# Patient Record
Sex: Male | Born: 1990
Health system: Southern US, Community
[De-identification: ages and names within clinical notes are randomized; demographics above are authoritative.]

## PROBLEM LIST (undated history)

## (undated) DIAGNOSIS — Q675 Congenital deformity of spine: Secondary | ICD-10-CM

## (undated) DIAGNOSIS — R1084 Generalized abdominal pain: Secondary | ICD-10-CM

## (undated) DIAGNOSIS — R0602 Shortness of breath: Secondary | ICD-10-CM

## (undated) DIAGNOSIS — L708 Other acne: Secondary | ICD-10-CM

## (undated) DIAGNOSIS — F411 Generalized anxiety disorder: Secondary | ICD-10-CM

## (undated) DIAGNOSIS — R51 Headache: Secondary | ICD-10-CM

## (undated) DIAGNOSIS — L600 Ingrowing nail: Secondary | ICD-10-CM

## (undated) HISTORY — DX: Shortness of breath: R06.02

## (undated) HISTORY — DX: Congenital deformity of spine: Q67.5

## (undated) HISTORY — DX: Other acne: L70.8

## (undated) HISTORY — DX: Generalized anxiety disorder: F41.1

## (undated) HISTORY — DX: Headache: R51

## (undated) HISTORY — DX: Generalized abdominal pain: R10.84

## (undated) HISTORY — DX: Ingrowing nail: L60.0

---
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
## 2011-01-16 ENCOUNTER — Encounter: Payer: Self-pay | Admitting: Family Medicine

## 2011-01-19 ENCOUNTER — Ambulatory Visit (INDEPENDENT_AMBULATORY_CARE_PROVIDER_SITE_OTHER): Payer: 59 | Admitting: Family Medicine

## 2011-01-19 ENCOUNTER — Encounter: Payer: Self-pay | Admitting: Family Medicine

## 2011-01-19 DIAGNOSIS — B079 Viral wart, unspecified: Secondary | ICD-10-CM

## 2011-01-19 DIAGNOSIS — G43109 Migraine with aura, not intractable, without status migrainosus: Secondary | ICD-10-CM

## 2011-01-19 DIAGNOSIS — B078 Other viral warts: Secondary | ICD-10-CM

## 2011-01-19 MED ORDER — CYCLOBENZAPRINE HCL 5 MG PO TABS: 5.0000 mg | ORAL_TABLET | Freq: Two times a day (BID) | ORAL | Status: DC | PRN

## 2011-01-19 NOTE — Patient Instructions (Addendum)
Keep headache diary to see if we can find triggers for your migraines. Return in 1-2 months for follow up with Dr. Ermalene Searing. Flexeril for migraines provided today - watch it can make you sleepy so just take at home. Take medicine for headaches right at onset of headache to prevent it from lasting the whole period of 3 days. If any confusion, dizziness, or other changes or worsening, please return sooner to be seen. Call us with questions.  Migraine Headache A migraine headache is an intense, throbbing pain on one or both sides of your head. The exact cause of a migraine headache is not always known. A migraine may be caused when nerves in the brain become irritated and release chemicals that cause swelling (inflammation) within blood vessels, causing pain. Many migraine sufferers have a family history of migraines. Before you get a migraine you may or may not get an aura. An aura is a group of symptoms that can predict the beginning of a migraine. An aura may include:  Visual changes such as:   Flashing lights.   Seeing bright spots or zig-zag lines.   Tunnel vision.   Feelings of numbness.   Trouble talking.   Muscle weakness.  SYMPTOMS OF A MIGRAINE  A migraine headache has one or more of the following symptoms:  Pain on one or both sides of your head.   Pain that is pulsating or throbbing in nature.   Pain that is severe enough to prevent daily activities.   Pain that is aggravated by any daily physical activity.   Nausea (feeling sick to your stomach), vomiting or both.   Pain with exposure to bright lights, loud noises or activity.   General sensitivity to bright lights or loud noises.  MIGRAINE TRIGGERS A migraine headache can be triggered by many things. Examples of triggers include:   Alcohol.   Smoking.   Stress.   It may be related to menses (male menstruation).   Aged cheeses.   Foods or drinks that contain nitrates, glutamate, aspartame or tyramine.     Lack of sleep.   Chocolate.   Caffeine.   Hunger.   Medications such as nitroglycerine (used to treat chest pain), birth control pills, estrogen and some blood pressure medications.  DIAGNOSIS  A migraine headache is often diagnosed based on:   Your symptoms.   Physical examination.  HOME CARE INSTRUCTIONS  Medications can help prevent migraines if they are recurrent or should they become recurrent. Your caregiver can help you with a medication or treatment program that will be helpful to you.   If you get a migraine, it may be helpful to lie down in a dark, quiet room.   It may be helpful to keep a headache diary. This may help you find a trend as to what may be triggering your headaches.  SEEK IMMEDIATE MEDICAL CARE IF:   You do not get relief from the medications given to you or you have a recurrence of pain.   You have confusion, personality changes or seizures.   You have headaches that wake you from sleep.   You have an increased frequency in your headaches.   You have a stiff neck.   You have a loss of vision.   You have muscle weakness.   You start losing your balance or have trouble walking.   You feel faint or pass out.  MAKE SURE YOU:   Understand these instructions.   Will watch your condition.   Will  get help right away if you are not doing well or get worse.  Document Released: 10/12/2005 Document Re-Released: 08/09/2009 [facility] Patient Information 2011 [place], [place].

## 2011-01-19 NOTE — Assessment & Plan Note (Signed)
rec keep headache diary Discussed avoidance of triggers. Discussed backing away from smoking, EtOH, and MJ. Discussed flexeril use for abortive.  Not frequent enough to merit ppx medication. Not progressively worsening, no red flags.   F/u 1-2 mo with PCP.

## 2011-01-19 NOTE — Assessment & Plan Note (Signed)
Discussed otc treatment with salicylic acid for periungal and plantar.

## 2011-01-19 NOTE — Progress Notes (Signed)
  Subjective:    Patient ID: [name], male    DOB: [birth year], 20 y.o.   MRN: [id number]  CC: frequent migraines  HPI HA issues for 2-3 years now.  Staying about same, no improvement, no worsening.  Told he has migraines in past.  Has seen neurologist in past, s/p EEG, "no seizure" per patient.  "Spaces out" prior to headache, vision "goes out" and sees squiggly line at corner of eye.  Describes vision issues as part of visual field lost 2/2 "wavy lines", never full visual loss or blacking out.  This lasts 1 day.  About 16 hrs into this starts having HA.  Described as usually R sided and posterior, pressure spreading through head.  This becomes sharp R sided boring pain as well as post ocular pain.  Takes tylenol or advil when this happens.  Also tried friend's oxycodone once.  HA complex usually lasts 3 days.  + photo/phonophobia, nausea.  Sensitive to smells.  No vomiting.  Cool environment helps.  Doesn't think getting enough sleep.  HA happen monthly or every 2 months.  Usually summer time worsens headaches.  Unsure about other triggers.  H/o anxiety and panic attacks.  Feels anxiety when gets headache.  No caffeine.  Smoking 1 cigarette/day.  MJ every other date.  EtOH occasionally.  12th grade at [employer], wants to go to [employer], hasn't looked into this.  Passing all classes.  Not stressed at school or at home.  Also asks about warts on knees and one on thumb, one on R foot  Review of Systems Per HPI. No dizziness.  No lightheadedness. No recent LOC, no dysarthria. No snoring.    Objective:   Physical Exam  [vitalsreviewed. Constitutional: He is oriented to person, place, and time. He appears well-developed and well-nourished. No distress.  HENT:  Head: Normocephalic and atraumatic.  Right Ear: External ear normal.  Left Ear: External ear normal.  Mouth/Throat: Oropharynx is clear and moist. No oropharyngeal exudate.  Eyes: Conjunctivae are normal. Pupils are equal,  round, and reactive to light. No scleral icterus.  Neck: Normal range of motion. Neck supple. No thyromegaly present.  Cardiovascular: Normal rate, regular rhythm, normal heart sounds and intact distal pulses.  Exam reveals no gallop.   No murmur heard. Pulmonary/Chest: Effort normal and breath sounds normal. No respiratory distress. He has no wheezes. He has no rales.  Neurological: He is alert and oriented to person, place, and time. He has normal strength and normal reflexes. He is not disoriented. No cranial nerve deficit or sensory deficit. He exhibits normal muscle tone. He displays a negative Romberg sign. Coordination and gait normal.       Normal FTN, HTS, neg pronator drift, no dysdiadokokinesia, able to heel and toe walk. Temperature and texture discrimination intact Intact visual fields.  Skin:       L thumb periungal veruccae. R plantar wart bialteral knee verrucae vulgaris, larger (~2cm)          Assessment & Plan:

## 2011-03-21 ENCOUNTER — Encounter: Payer: Self-pay | Admitting: Family Medicine

## 2011-03-27 ENCOUNTER — Ambulatory Visit: Payer: 59 | Admitting: Family Medicine

## 2011-04-28 ENCOUNTER — Ambulatory Visit: Payer: 59 | Admitting: Family Medicine

## 2011-07-13 ENCOUNTER — Other Ambulatory Visit: Payer: Self-pay | Admitting: *Deleted

## 2011-07-13 MED ORDER — CYCLOBENZAPRINE HCL 5 MG PO TABS: 5.0000 mg | ORAL_TABLET | Freq: Two times a day (BID) | ORAL | Status: DC | PRN

## 2011-07-13 NOTE — Telephone Encounter (Signed)
Pt's mother is requesting a refill on flexeril, pt has had a headache since Saturday.  Uses [facility].

## 2011-07-13 NOTE — Telephone Encounter (Signed)
Reviewed 12/2010 note. Flexeril for migraine  Notify pt that rx sent in.

## 2011-07-13 NOTE — Telephone Encounter (Signed)
Patient mother advised

## 2013-06-07 ENCOUNTER — Ambulatory Visit (INDEPENDENT_AMBULATORY_CARE_PROVIDER_SITE_OTHER)
Admission: RE | Admit: 2013-06-07 | Discharge: 2013-06-07 | Disposition: A | Discharge status: Home / Self Care | Payer: 59 | Source: Ambulatory Visit | Attending: Family Medicine | Admitting: Family Medicine

## 2013-06-07 ENCOUNTER — Encounter: Payer: Self-pay | Admitting: Family Medicine

## 2013-06-07 ENCOUNTER — Ambulatory Visit (INDEPENDENT_AMBULATORY_CARE_PROVIDER_SITE_OTHER): Payer: 59 | Admitting: Family Medicine

## 2013-06-07 VITALS — BP 102/54 | HR 80 | Temp 98.3°F | Ht 62.5 in | Wt 128.0 lb

## 2013-06-07 DIAGNOSIS — M8580 Other specified disorders of bone density and structure, unspecified site: Secondary | ICD-10-CM

## 2013-06-07 DIAGNOSIS — B078 Other viral warts: Secondary | ICD-10-CM

## 2013-06-07 DIAGNOSIS — B079 Viral wart, unspecified: Secondary | ICD-10-CM

## 2013-06-07 DIAGNOSIS — M948X9 Other specified disorders of cartilage, unspecified sites: Secondary | ICD-10-CM

## 2013-06-07 DIAGNOSIS — R0602 Shortness of breath: Secondary | ICD-10-CM

## 2013-06-07 DIAGNOSIS — M954 Acquired deformity of chest and rib: Secondary | ICD-10-CM | POA: Insufficient documentation

## 2013-06-07 NOTE — Progress Notes (Signed)
  Subjective:    Patient ID: [name], male    DOB: 1991-05-25, 22 y.o.   MRN: [id number]  HPI  22 year old male presents with new plantars wart on right foot at heel. Noted in last few weeks.  He has not treated with any OTC  Meds. Area is tender to pressure with standing.   He also has noted that left rib cage at base is more prominent than right, also area of indentation. Has been present for years. He and his mother is worried that this may cause SOB and may be due for this ? Bone disorder they had. At age 22 he had injury from handle bars during fall He has some occ shortness of breath ongoing for years, intermittant... Feels like he cannot fill his lungs completely. Sometimes worse than others. Had breathing test nml in past in 2010. He has benign bone disorder... Seen at [facility]... We have no records.  He does have some anxiety... He is worried that something is always about to happen.  No recent panic attacks. Cannot relax fully. No depression, no suicidal thoughts.     Review of Systems  Constitutional: Negative for fever and fatigue.  HENT: Negative for ear pain.   Eyes: Negative for pain.  Respiratory: Positive for shortness of breath. Negative for chest tightness and wheezing.   Cardiovascular: Positive for chest pain. Negative for palpitations and leg swelling.  Gastrointestinal: Negative for abdominal pain.  Neurological:       Occ tingling in lips, numbness in arms, irritability, feels like he has nto be moving, going.       Objective:   Physical Exam  Constitutional: Vital signs are normal. He appears well-developed and well-nourished.  HENT:  Head: Normocephalic.  Right Ear: Hearing normal.  Left Ear: Hearing normal.  Nose: Nose normal.  Mouth/Throat: Oropharynx is clear and moist and mucous membranes are normal.  Neck: Trachea normal. Carotid bruit is not present. No mass and no thyromegaly present.  Cardiovascular: Normal rate, regular  rhythm and normal pulses.  Exam reveals no gallop, no distant heart sounds and no friction rub.   No murmur heard. No peripheral edema  Pulmonary/Chest: Effort normal and breath sounds normal. No respiratory distress.  Left chest wall prominent with more lateral indentation.  Skin: Skin is warm, dry and intact. No rash noted.  Plantar wart on left heelp  Psychiatric: His speech is normal. Thought content normal. His mood appears anxious. He is agitated. He expresses no homicidal and no suicidal ideation. He expresses no suicidal plans and no homicidal plans.          Assessment & Plan:

## 2013-06-07 NOTE — Assessment & Plan Note (Signed)
Eval with CXR, rib films.

## 2013-06-07 NOTE — Assessment & Plan Note (Signed)
Previous PFTs nml. ? Related to restriction from chest wall deformity. Will eval with X-ray.  Consider anxiety as primary diagnosis given pt appearance today.

## 2013-06-07 NOTE — Assessment & Plan Note (Signed)
Treat with salicylic acid. Follow up if not improving.

## 2013-06-07 NOTE — Patient Instructions (Addendum)
Apply salicylic acid daily cover with duct tape... Plan concsistent application for 1-2 months. Call for cryotherapy if not resolving at that point. We will call with X-ray results.

## 2013-06-07 NOTE — Assessment & Plan Note (Signed)
Per [facility]... Likely benign process.. Not consistent with meloheostosis.

## 2013-06-13 ENCOUNTER — Ambulatory Visit (INDEPENDENT_AMBULATORY_CARE_PROVIDER_SITE_OTHER): Payer: 59 | Admitting: Family Medicine

## 2013-06-13 ENCOUNTER — Encounter: Payer: Self-pay | Admitting: Family Medicine

## 2013-06-13 VITALS — BP 104/58 | HR 84 | Temp 98.2°F | Ht 62.5 in | Wt 126.0 lb

## 2013-06-13 DIAGNOSIS — R0602 Shortness of breath: Secondary | ICD-10-CM

## 2013-06-13 DIAGNOSIS — M954 Acquired deformity of chest and rib: Secondary | ICD-10-CM

## 2013-06-13 DIAGNOSIS — F411 Generalized anxiety disorder: Secondary | ICD-10-CM

## 2013-06-13 DIAGNOSIS — R002 Palpitations: Secondary | ICD-10-CM

## 2013-06-13 DIAGNOSIS — F410 Panic disorder [episodic paroxysmal anxiety] without agoraphobia: Secondary | ICD-10-CM

## 2013-06-13 MED ORDER — ALPRAZOLAM 0.25 MG PO TABS: 0.2500 mg | ORAL_TABLET | Freq: Every day | ORAL | Status: DC | PRN

## 2013-06-13 NOTE — Assessment & Plan Note (Signed)
Recent chest wall deformity evaluated with rib and CXR films... No bony or pulmponary abnormality seen.

## 2013-06-13 NOTE — Assessment & Plan Note (Signed)
Nml EKG. 

## 2013-06-13 NOTE — Patient Instructions (Signed)
Stop at front desk to get info on counseling referral.. Call if interested.  Use alprazolam ( xanax ) in a limited fashion as needed for anxiety.  Follow up anxiety in 1 month.

## 2013-06-13 NOTE — Progress Notes (Addendum)
  Subjective:    Patient ID: [name], male    DOB: 07-Jan-1991, 21 y.o.   MRN: [id number]  HPI  22 year old male with history of generalized anxiety presents for follow up from last week ( 8/13).  At last OV he mentioned longstanding deformity in left rib cage and possible history of hyperostosis. CXR and rb films showed no abnormality.   In discussion it was noted at times he feels  SOB (cannot get a full breath), tingly in lips and limbs. Some at the same time.. Some isolated.  Sometimes feels heart skips a beat, occ fast. He feels anxious right now and at last OV.  3-4 times a week, more minor, more severe episodes few times a month.  Feels like he has to yawn a lot. He has had this longstanding, but does bother him significantly.  He worries more about things.. Really ever since 2nd grade. He cannot relax.. Always feels shaky , not relaxed.   Tried melatonin.Marland Kitchen Helped with sleep.      Review of Systems  Constitutional: Negative for fever and fatigue.  HENT: Negative for ear pain.   Eyes: Negative for pain.  Respiratory: Positive for shortness of breath. Negative for cough, choking and wheezing.   Cardiovascular: Positive for palpitations. Negative for chest pain and leg swelling.  Gastrointestinal: Negative for abdominal pain.  Neurological: Negative for syncope.  Psychiatric/Behavioral: Positive for sleep disturbance and agitation. Negative for suicidal ideas and self-injury. The patient is nervous/anxious.        Objective:   Physical Exam  Constitutional: Vital signs are normal. He appears well-developed and well-nourished.  HENT:  Head: Normocephalic.  Right Ear: Hearing normal.  Left Ear: Hearing normal.  Nose: Nose normal.  Mouth/Throat: Oropharynx is clear and moist and mucous membranes are normal.  Neck: Trachea normal. Carotid bruit is not present. No mass and no thyromegaly present.  Cardiovascular: Normal rate, regular rhythm and normal pulses.  Exam  reveals no gallop, no distant heart sounds and no friction rub.   No murmur heard. No peripheral edema  Pulmonary/Chest: Effort normal and breath sounds normal. No respiratory distress.  Skin: Skin is warm, dry and intact. No rash noted.  Psychiatric: Judgment and thought content normal. His mood appears anxious. His speech is not rapid and/or pressured and not slurred. He is agitated. Cognition and memory are normal. He does not exhibit a depressed mood. He expresses no homicidal and no suicidal ideation. He expresses no suicidal plans and no homicidal plans.          Assessment & Plan:

## 2013-06-13 NOTE — Assessment & Plan Note (Signed)
Most likely related to anxiety.

## 2013-06-13 NOTE — Assessment & Plan Note (Signed)
Most likely the cause of pt symptoms. Consistent with panic attacks. Referred to counseling for stress reduction, relation therapy. Started on alprazolam for limited time. If not improving at next OV... Consider initiation of zoloft or wellbutrin ( to avoid ED issues)

## 2013-07-04 ENCOUNTER — Ambulatory Visit: Payer: 59 | Admitting: Psychology

## 2013-07-14 ENCOUNTER — Ambulatory Visit (INDEPENDENT_AMBULATORY_CARE_PROVIDER_SITE_OTHER): Payer: 59 | Admitting: Family Medicine

## 2013-07-14 ENCOUNTER — Encounter: Payer: Self-pay | Admitting: Family Medicine

## 2013-07-14 VITALS — BP 100/64 | HR 84 | Temp 98.4°F | Ht 62.5 in | Wt 128.2 lb

## 2013-07-14 DIAGNOSIS — F411 Generalized anxiety disorder: Secondary | ICD-10-CM

## 2013-07-14 DIAGNOSIS — B078 Other viral warts: Secondary | ICD-10-CM

## 2013-07-14 DIAGNOSIS — B079 Viral wart, unspecified: Secondary | ICD-10-CM

## 2013-07-14 NOTE — Progress Notes (Signed)
  Subjective:    Patient ID: [name], male    DOB: 22-May-1991, 21 y.o.   MRN: [id number]  HPI 22 year old male returns for follow up on anxiety.  At last OV 1 month ago: I felt   Anxiety most likely the cause of pt multiple somatic symptoms. Consistent with panic attacks. Referred to counseling for stress reduction, relation therapy. Started on alprazolam for limited time. If not improving at next OV... Consider initiation of zoloft or wellbutrin ( to avoid ED issues)   He reports that xanax helped  Significantly with mood but felt very fatigued afterward, woozy. Mood overall improved from last OV. Sleeping better at night. He was unable to see counselor due to cost.        Review of Systems  Constitutional: Negative for fever and fatigue.  HENT: Negative for nosebleeds.   Eyes: Negative for pain.  Respiratory: Negative for chest tightness and shortness of breath.        Objective:   Physical Exam  Skin:  Macerated tissue at right heel under wart pad, no central core seen.          Assessment & Plan:

## 2013-07-14 NOTE — Patient Instructions (Signed)
Let me know if anxiety becomes more problematic again. Okay to use alprazolam on limited basis.  Continue treatment of planta wart.

## 2013-07-14 NOTE — Assessment & Plan Note (Signed)
Continue current treatment course.

## 2013-07-14 NOTE — Assessment & Plan Note (Signed)
Improved with stress reduction. Does not like xanax causing fatigue so limiting use.

## 2014-01-16 ENCOUNTER — Encounter: Payer: Self-pay | Admitting: Family Medicine

## 2014-01-16 ENCOUNTER — Ambulatory Visit (INDEPENDENT_AMBULATORY_CARE_PROVIDER_SITE_OTHER): Payer: 59 | Admitting: Family Medicine

## 2014-01-16 VITALS — BP 96/50 | HR 82 | Temp 98.2°F | Ht 62.5 in | Wt 129.8 lb

## 2014-01-16 DIAGNOSIS — G5620 Lesion of ulnar nerve, unspecified upper limb: Secondary | ICD-10-CM

## 2014-01-16 DIAGNOSIS — B078 Other viral warts: Secondary | ICD-10-CM

## 2014-01-16 DIAGNOSIS — H6980 Other specified disorders of Eustachian tube, unspecified ear: Secondary | ICD-10-CM

## 2014-01-16 DIAGNOSIS — R0602 Shortness of breath: Secondary | ICD-10-CM

## 2014-01-16 DIAGNOSIS — B079 Viral wart, unspecified: Secondary | ICD-10-CM

## 2014-01-16 NOTE — Patient Instructions (Signed)
Call for derm referral if warts returning. Avoid compression of ulnar nerve.  Do not sleep on back. If sleeping issue occur more frequently.. Call for sleep stuidy.

## 2014-01-16 NOTE — Progress Notes (Signed)
   Subjective:    Patient ID: [name], male    DOB: January 16, 1991, 23 y.o.   MRN: [id number]  Otalgia  There is pain in the left ear. This is a new problem. The current episode started in the past 7 days. The problem occurs every few hours. There has been no fever. The patient is experiencing no pain (describes as more of a tickle in ear). Associated symptoms include a sore throat. Pertinent negatives include no coughing, headaches, rash, rhinorrhea or vomiting. Associated symptoms comments:  ST resolved now.. He has tried nothing for the symptoms. There is no history of a chronic ear infection, hearing loss or a tympanostomy tube.   23 year old  male presents with continue warts now on right  Heel, left dorsal anterior foot and on left 2nd finger tip.   Has also noted that he may stop breathing at night if he is lying on back at night.  Has noted 3-4 times in last 1-2 years. Not associated with any specific dream. Last time he had was 2-3 weeks ago.  Improves after a few breaths.  No exertion chest pain  And SOb.  No snoring.  No observe sleep apnea.  No morning headache, no high blood pressure. He is not overweight.      Review of Systems  HENT: Positive for ear pain and sore throat. Negative for rhinorrhea.   Respiratory: Negative for cough.   Gastrointestinal: Negative for vomiting.  Skin: Negative for rash.  Neurological: Negative for headaches.       Objective:   Physical Exam  Constitutional: Vital signs are normal. He appears well-developed and well-nourished.  HENT:  Head: Normocephalic.  Right Ear: Hearing normal. No middle ear effusion.  Left Ear: Hearing normal.  No middle ear effusion.  Nose: Nose normal.  Mouth/Throat: Mucous membranes are normal. Posterior oropharyngeal erythema present.  Neck: Trachea normal. Carotid bruit is not present. No mass and no thyromegaly present.  Cardiovascular: Normal rate, regular rhythm and normal pulses.  Exam reveals  no gallop, no distant heart sounds and no friction rub.   No murmur heard. No peripheral edema  Pulmonary/Chest: Effort normal and breath sounds normal. No respiratory distress.  Skin: Skin is warm, dry and intact. No rash noted.  multiple warts at 3 sites, right heel, left anterior plantar foot, and left 2nd digit tip.   Psychiatric: He has a normal mood and affect. His speech is normal and behavior is normal. Thought content normal.     Positive ulnar compression B,     Assessment & Plan:     Procedure note: Crypotherapy of multiple warts at 3 sites, right heel, left anterior plantar foot, and left 2nd digit tip. Areas on feet debrided with 15 blade prior to cryotherapy. 3 mm halo, frozen 3 times each location.  No bleeding, no complications.

## 2014-01-16 NOTE — Progress Notes (Signed)
Pre visit review using our clinic review tool, if applicable. No additional management support is needed unless otherwise documented below in the visit note. 

## 2014-01-17 ENCOUNTER — Telehealth: Payer: Self-pay | Admitting: Family Medicine

## 2014-01-17 DIAGNOSIS — H6980 Other specified disorders of Eustachian tube, unspecified ear: Secondary | ICD-10-CM | POA: Insufficient documentation

## 2014-01-17 DIAGNOSIS — G5620 Lesion of ulnar nerve, unspecified upper limb: Secondary | ICD-10-CM | POA: Insufficient documentation

## 2014-01-17 NOTE — Telephone Encounter (Signed)
Relevant patient education assigned to patient using Emmi. ° °

## 2014-01-17 NOTE — Assessment & Plan Note (Signed)
Discussed change of position of arms at work and sleep.

## 2014-01-17 NOTE — Assessment & Plan Note (Signed)
Body habitus not typicla of sleep apnea patients. ? Decreased breaething spells does not happen frequently.  Only occuring on back.  Change position at sleep.  if increasing in frequency.. eval with sleep study.

## 2014-01-17 NOTE — Assessment & Plan Note (Addendum)
Most likely cause of symptoms. No sign of ear infeciton.  Recommended nasal saline and mucinex.

## 2014-02-01 ENCOUNTER — Encounter: Payer: Self-pay | Admitting: Podiatry

## 2014-02-01 ENCOUNTER — Ambulatory Visit (INDEPENDENT_AMBULATORY_CARE_PROVIDER_SITE_OTHER): Payer: 59 | Admitting: Podiatry

## 2014-02-01 VITALS — Ht 66.0 in | Wt 135.0 lb

## 2014-02-01 DIAGNOSIS — B079 Viral wart, unspecified: Secondary | ICD-10-CM

## 2014-02-01 MED ORDER — FLUOROURACIL 5 % EX CREA: TOPICAL_CREAM | Freq: Two times a day (BID) | CUTANEOUS | Status: DC

## 2014-02-01 NOTE — Progress Notes (Signed)
   Subjective:    Patient ID: [name], male    DOB: 05/07/1991, 23 y.o.   MRN: [id number]  HPI Comments: Plantars warts on both feet, the worse one is on the right foot heel, its been there for a while . Left foot plantar 3rd met. Froze it once      Review of Systems  All other systems reviewed and are negative.      Objective:   Physical Exam: I have reviewed his past medical history medications allergies surgeries social history and review of systems. Pulses to the bilateral foot are intact. Neurologic sensorium is intact. Cutaneous evaluation demonstrates supple well hydrated cutis with exception of verrucoid lesions to the plantar lateral aspect of the right heel. Multiple in nature and a solitary lesion to the central distal aspect of the plantar left foot. Thrombosed capillaries are visible on debridement to both feet. Skin lines do circumvent the lesion all consistent with verruca plantaris.        Assessment & Plan:  Assessment: Verruca plantaris bilateral foot.  Plan: Chemical destruction with acid today under occlusion to be washed off thoroughly tomorrow. He was start Efudex twice daily under occlusion and I will followup with him in 6 weeks

## 2014-02-08 ENCOUNTER — Ambulatory Visit: Payer: Self-pay | Admitting: Podiatrist

## 2014-03-15 ENCOUNTER — Ambulatory Visit: Payer: 59 | Admitting: Podiatry

## 2014-12-18 ENCOUNTER — Encounter: Payer: Self-pay | Admitting: Family Medicine

## 2014-12-18 ENCOUNTER — Ambulatory Visit (INDEPENDENT_AMBULATORY_CARE_PROVIDER_SITE_OTHER): Payer: 59 | Admitting: Family Medicine

## 2014-12-18 VITALS — BP 122/70 | HR 77 | Temp 98.2°F | Ht 66.25 in | Wt 138.5 lb

## 2014-12-18 DIAGNOSIS — R301 Vesical tenesmus: Secondary | ICD-10-CM

## 2014-12-18 DIAGNOSIS — R109 Unspecified abdominal pain: Secondary | ICD-10-CM

## 2014-12-18 DIAGNOSIS — J209 Acute bronchitis, unspecified: Secondary | ICD-10-CM

## 2014-12-18 DIAGNOSIS — N3289 Other specified disorders of bladder: Secondary | ICD-10-CM | POA: Insufficient documentation

## 2014-12-18 LAB — POCT URINALYSIS DIPSTICK
Bilirubin, UA: NEGATIVE
Glucose, UA: NEGATIVE
Ketones, UA: NEGATIVE
LEUKOCYTES UA: NEGATIVE
Nitrite, UA: NEGATIVE
PH UA: 6
PROTEIN UA: NEGATIVE
Spec Grav, UA: >=1.03
UROBILINOGEN UA: 0.2

## 2014-12-18 MED ORDER — AZITHROMYCIN 250 MG PO TABS: ORAL_TABLET | ORAL | Status: DC

## 2014-12-18 MED ORDER — GUAIFENESIN-CODEINE 100-10 MG/5ML PO SYRP: 5.0000 mL | ORAL_SOLUTION | Freq: Every evening | ORAL | Status: DC | PRN

## 2014-12-18 NOTE — Progress Notes (Signed)
Pre visit review using our clinic review tool, if applicable. No additional management support is needed unless otherwise documented below in the visit note. 

## 2014-12-18 NOTE — Assessment & Plan Note (Signed)
No sign of stone or infection. Likely bladder irritation.. Pt has been drinking a lot of OJ.  Push water and avoid irritants.

## 2014-12-18 NOTE — Patient Instructions (Addendum)
Quit smoking. Complete antibiotics for bronchitis. Cough suppressant at night for cough.  Your urine is clear. No blood or sign of infeciton.  Avoid bladder irritants like citris, tomatos, spicy food, soda, caffeine, alcohol etc.

## 2014-12-18 NOTE — Progress Notes (Signed)
   Subjective:    Patient ID: [name], male    DOB: 1991-04-25, 24 y.o.   MRN: [id number]  Cough This is a new problem. The current episode started 1 to 4 weeks ago. The problem has been unchanged. The cough is productive of sputum. Associated symptoms include headaches, nasal congestion, a sore throat, shortness of breath and wheezing. Pertinent negatives include no chest pain, chills, ear congestion, ear pain or fever. The symptoms are aggravated by lying down (cough keeps him up at night.). Risk factors for lung disease include smoking/tobacco exposure (Has been cutting back.). Treatments tried: mucinex DM.  Flank Pain This is a new problem. The current episode started 1 to 4 weeks ago ( 2 weeks a go). The problem has been waxing and waning (several episodes, no current pain) since onset. The quality of the pain is described as stabbing. The pain is moderate. Associated symptoms include abdominal pain and headaches. Pertinent negatives include no bladder incontinence, chest pain, dysuria, fever, leg pain, paresthesias or weakness. (Started with sharp pain in tip of penis,  Radiated to abdomen.) He has tried nothing for the symptoms.      Review of Systems  Constitutional: Negative for fever and chills.  HENT: Positive for sore throat. Negative for ear pain.   Respiratory: Positive for cough, shortness of breath and wheezing.   Cardiovascular: Negative for chest pain.  Gastrointestinal: Positive for abdominal pain.  Genitourinary: Positive for flank pain. Negative for bladder incontinence and dysuria.  Neurological: Positive for headaches. Negative for weakness and paresthesias.       Objective:   Physical Exam  Constitutional: Vital signs are normal. He appears well-developed and well-nourished.  Non-toxic appearance. He does not appear ill. No distress.  HENT:  Head: Normocephalic and atraumatic.  Right Ear: Hearing, tympanic membrane, external ear and ear canal normal. No  tenderness. No foreign bodies. Tympanic membrane is not retracted and not bulging.  Left Ear: Hearing, tympanic membrane, external ear and ear canal normal. No tenderness. No foreign bodies. Tympanic membrane is not retracted and not bulging.  Nose: Nose normal. No mucosal edema or rhinorrhea. Right sinus exhibits no maxillary sinus tenderness and no frontal sinus tenderness. Left sinus exhibits no maxillary sinus tenderness and no frontal sinus tenderness.  Mouth/Throat: Uvula is midline, oropharynx is clear and moist and mucous membranes are normal. Normal dentition. No dental caries. No oropharyngeal exudate or tonsillar abscesses.  Eyes: Conjunctivae, EOM and lids are normal. Pupils are equal, round, and reactive to light. Lids are everted and swept, no foreign bodies found.  Neck: Trachea normal, normal range of motion and phonation normal. Neck supple. Carotid bruit is not present. No thyroid mass and no thyromegaly present.  Cardiovascular: Normal rate, regular rhythm, S1 normal, S2 normal, normal heart sounds, intact distal pulses and normal pulses.  Exam reveals no gallop.   No murmur heard. Pulmonary/Chest: Effort normal and breath sounds normal. No respiratory distress. He has no wheezes. He has no rhonchi. He has no rales.  Abdominal: Soft. Normal appearance and bowel sounds are normal. There is no hepatosplenomegaly. There is no tenderness. There is no rebound, no guarding and no CVA tenderness. No hernia.  Neurological: He is alert. He has normal reflexes.  Skin: Skin is warm, dry and intact. No rash noted.  Psychiatric: He has a normal mood and affect. His speech is normal and behavior is normal. Judgment normal.          Assessment & Plan:

## 2014-12-18 NOTE — Assessment & Plan Note (Signed)
Treat with antibiotics and cough suppressant. Rest, fluids.

## 2015-10-08 ENCOUNTER — Encounter: Payer: Self-pay | Admitting: Family Medicine

## 2015-10-08 ENCOUNTER — Ambulatory Visit (INDEPENDENT_AMBULATORY_CARE_PROVIDER_SITE_OTHER): Payer: 59 | Admitting: Family Medicine

## 2015-10-08 VITALS — BP 90/60 | HR 71 | Temp 98.2°F | Ht 66.25 in | Wt 141.5 lb

## 2015-10-08 DIAGNOSIS — D2322 Other benign neoplasm of skin of left ear and external auricular canal: Secondary | ICD-10-CM

## 2015-10-08 DIAGNOSIS — L089 Local infection of the skin and subcutaneous tissue, unspecified: Secondary | ICD-10-CM

## 2015-10-08 DIAGNOSIS — L729 Follicular cyst of the skin and subcutaneous tissue, unspecified: Secondary | ICD-10-CM | POA: Diagnosis not present

## 2015-10-08 MED ORDER — MUPIROCIN 2 % EX OINT: 1.0000 "application " | TOPICAL_OINTMENT | Freq: Every day | CUTANEOUS | Status: DC

## 2015-10-08 NOTE — Patient Instructions (Signed)
Apply warm compress to right ear 2 times daily Apply prescription  antibiotic ointment. No treatment need to cyst on left ear lobe.

## 2015-10-08 NOTE — Assessment & Plan Note (Signed)
Resolving. Treat with warm compresses, topical antibiotic oint. Discussed nature of cysts and likely recurrence.

## 2015-10-08 NOTE — Progress Notes (Signed)
Pre visit review using our clinic review tool, if applicable. No additional management support is needed unless otherwise documented below in the visit note. 

## 2015-10-08 NOTE — Progress Notes (Signed)
   Subjective:    Patient ID: [name] Reason IV, male    DOB: 05-07-1991, 24 y.o.   MRN: [id number]  HPI  24 year old male pt presents with new onset knot on ears.  He reports he noted 1 month ago. Pain behind ear on lower lobe, swollen area.  At top of left ear.. On pinna.. Nodule. Nontender.  He used homemade tool to incise and drain right lobe nodule 2-3 days ago  Discharge and pus came from right ear lobe nodule.    Has had some acne on ears in past. This feels different.     Review of Systems  Constitutional: Negative for fever and fatigue.  HENT: Negative for ear pain, postnasal drip and sinus pressure.   Eyes: Negative for pain.  Respiratory: Negative for cough and shortness of breath.   Cardiovascular: Negative for chest pain.       Objective:   Physical Exam  Constitutional: Vital signs are normal. He appears well-developed and well-nourished.  HENT:  Head: Normocephalic.  Right Ear: Hearing normal. No middle ear effusion.  Left Ear: Hearing normal.  No middle ear effusion.  Nose: Nose normal.  Mouth/Throat: Oropharynx is clear and moist and mucous membranes are normal.  Right ear lower lobe posterior, scab and nodule, nontender, no redness   LEft upper pinna, nodule, no erythema, non tender.  Neck: Trachea normal. Carotid bruit is not present. No thyroid mass and no thyromegaly present.  Cardiovascular: Normal rate, regular rhythm and normal pulses.  Exam reveals no gallop, no distant heart sounds and no friction rub.   No murmur heard. No peripheral edema  Pulmonary/Chest: Effort normal and breath sounds normal. No respiratory distress.  Skin: Skin is warm, dry and intact. No rash noted.  Psychiatric: He has a normal mood and affect. His speech is normal and behavior is normal. Thought content normal.          Assessment & Plan:

## 2015-10-08 NOTE — Assessment & Plan Note (Signed)
No treatment needed.

## 2015-11-15 ENCOUNTER — Encounter: Payer: Self-pay | Admitting: Family Medicine

## 2015-11-15 ENCOUNTER — Ambulatory Visit (INDEPENDENT_AMBULATORY_CARE_PROVIDER_SITE_OTHER): Payer: 59 | Admitting: Family Medicine

## 2015-11-15 VITALS — BP 110/60 | HR 116 | Temp 101.8°F | Wt 146.2 lb

## 2015-11-15 DIAGNOSIS — J020 Streptococcal pharyngitis: Secondary | ICD-10-CM

## 2015-11-15 DIAGNOSIS — J029 Acute pharyngitis, unspecified: Secondary | ICD-10-CM

## 2015-11-15 DIAGNOSIS — F17200 Nicotine dependence, unspecified, uncomplicated: Secondary | ICD-10-CM

## 2015-11-15 DIAGNOSIS — Z720 Tobacco use: Secondary | ICD-10-CM

## 2015-11-15 LAB — POCT RAPID STREP A (OFFICE): RAPID STREP A SCREEN: NEGATIVE

## 2015-11-15 MED ORDER — PENICILLIN G BENZATHINE 1200000 UNIT/2ML IM SUSP
1.2000 10*6.[IU] | Freq: Once | INTRAMUSCULAR | Status: AC
  Administered 2015-11-15: 1.2 10*6.[IU] via INTRAMUSCULAR

## 2015-11-15 NOTE — Assessment & Plan Note (Signed)
Given presentation anticipate strep pharyngitis despite negative RST.  Offered oral vs IM PCN - pt opts for penicillin shot. Provided with 1.2 mil U bicillin IM today in office. rec out of work until fever free x 24 hrs.  Pt agrees with plan.

## 2015-11-15 NOTE — Progress Notes (Signed)
Pre visit review using our clinic review tool, if applicable. No additional management support is needed unless otherwise documented below in the visit note. 

## 2015-11-15 NOTE — Patient Instructions (Addendum)
Take 2 aleve twice daily as needed for fever. Shot of penicillin today for strep   Strep Throat Strep throat is a bacterial infection of the throat. Your health care provider may call the infection tonsillitis or pharyngitis, depending on whether there is swelling in the tonsils or at the back of the throat. Strep throat is most common during the cold months of the year in children who are 24-24 years of age, but it can happen during any season in people of any age. This infection is spread from person to person (contagious) through coughing, sneezing, or close contact. CAUSES Strep throat is caused by the bacteria called Streptococcus pyogenes. RISK FACTORS This condition is more likely to develop in:  People who spend time in crowded places where the infection can spread easily.  People who have close contact with someone who has strep throat. SYMPTOMS Symptoms of this condition include:  Fever or chills.   Redness, swelling, or pain in the tonsils or throat.  Pain or difficulty when swallowing.  White or yellow spots on the tonsils or throat.  Swollen, tender glands in the neck or under the jaw.  Red rash all over the body (rare). DIAGNOSIS This condition is diagnosed by performing a rapid strep test or by taking a swab of your throat (throat culture test). Results from a rapid strep test are usually ready in a few minutes, but throat culture test results are available after one or two days. TREATMENT This condition is treated with antibiotic medicine. HOME CARE INSTRUCTIONS Medicines  Take over-the-counter and prescription medicines only as told by your health care provider.  Take your antibiotic as told by your health care provider. Do not stop taking the antibiotic even if you start to feel better.  Have family members who also have a sore throat or fever tested for strep throat. They may need antibiotics if they have the strep infection. Eating and Drinking  Do not  share food, drinking cups, or personal items that could cause the infection to spread to other people.  If swallowing is difficult, try eating soft foods until your sore throat feels better.  Drink enough fluid to keep your urine clear or pale yellow. General Instructions  Gargle with a salt-water mixture 3-4 times per day or as needed. To make a salt-water mixture, completely dissolve -1 tsp of salt in 1 cup of warm water.  Make sure that all household members wash their hands well.  Get plenty of rest.  Stay home from school or work until you have been taking antibiotics for 24 hours.  Keep all follow-up visits as told by your health care provider. This is important. SEEK MEDICAL CARE IF:  The glands in your neck continue to get bigger.  You develop a rash, cough, or earache.  You cough up a thick liquid that is green, yellow-brown, or bloody.  You have pain or discomfort that does not get better with medicine.  Your problems seem to be getting worse rather than better.  You have a fever. SEEK IMMEDIATE MEDICAL CARE IF:  You have new symptoms, such as vomiting, severe headache, stiff or painful neck, chest pain, or shortness of breath.  You have severe throat pain, drooling, or changes in your voice.  You have swelling of the neck, or the skin on the neck becomes red and tender.  You have signs of dehydration, such as fatigue, dry mouth, and decreased urination.  You become increasingly sleepy, or you cannot wake up  completely.  Your joints become red or painful.   This information is not intended to replace advice given to you by your health care provider. Make sure you discuss any questions you have with your health care provider.   Document Released: 10/09/2000 Document Revised: 07/03/2015 Document Reviewed: 02/04/2015 Elsevier Interactive Patient Education [employer].

## 2015-11-15 NOTE — Progress Notes (Signed)
BP 110/60 mmHg  Pulse 116  Temp(Src) 101.8 F (38.8 C) (Oral)  Wt 146 lb 4 oz (66.339 kg)   CC: ST  Subjective:    Patient ID: [name], male    DOB: 1991/01/22, 25 y.o.   MRN: [id number]  HPI: [name] is a 25 y.o. male presenting on 11/15/2015 for Sore Throat   2d h/o ST, fever Tmax 101.8. Took aleve this morning at noon. + HA x2 wks treated with aleve. L ear feels clogged. Mild body aches.   No cough, congestion, ear or tooth pain,   Did not get flu shot this year.  No sick contacts at home.  Smoker.  No h/o asthma.   Relevant past medical, surgical, family and social history reviewed and updated as indicated. Interim medical history since our last visit reviewed. Allergies and medications reviewed and updated. Current Outpatient Prescriptions on File Prior to Visit  Medication Sig  . mupirocin ointment (BACTROBAN) 2 % Apply 1 application topically daily.   No current facility-administered medications on file prior to visit.    Review of Systems Per HPI unless specifically indicated in ROS section     Objective:    BP 110/60 mmHg  Pulse 116  Temp(Src) 101.8 F (38.8 C) (Oral)  Wt 146 lb 4 oz (66.339 kg)  Wt Readings from Last 3 Encounters:  11/15/15 146 lb 4 oz (66.339 kg)  10/08/15 141 lb 8 oz (64.184 kg)  12/18/14 138 lb 8 oz (62.823 kg)    Physical Exam  Constitutional: He appears well-developed and well-nourished. No distress.  Tired non toxic appearing  HENT:  Head: Normocephalic and atraumatic.  Right Ear: Hearing, tympanic membrane, external ear and ear canal normal.  Left Ear: Hearing, tympanic membrane, external ear and ear canal normal.  Nose: Mucosal edema (and erythema) present. No rhinorrhea. Right sinus exhibits no maxillary sinus tenderness and no frontal sinus tenderness. Left sinus exhibits no maxillary sinus tenderness and no frontal sinus tenderness.  Mouth/Throat: Uvula is midline and mucous membranes are normal.  Posterior oropharyngeal edema and posterior oropharyngeal erythema present. No oropharyngeal exudate or tonsillar abscesses.  Tonsillar exudates present  Eyes: Conjunctivae and EOM are normal. Pupils are equal, round, and reactive to light. No scleral icterus.  Neck: Normal range of motion. Neck supple.  Cardiovascular: Normal rate, regular rhythm, normal heart sounds and intact distal pulses.   No murmur heard. Pulmonary/Chest: Effort normal and breath sounds normal. No respiratory distress. He has no wheezes. He has no rales.  Abdominal: Soft. Bowel sounds are normal. He exhibits no distension and no mass. There is no hepatosplenomegaly. There is no tenderness. There is no rigidity, no rebound, no guarding, no CVA tenderness and negative Murphy's sign.  Lymphadenopathy:    He has cervical adenopathy (bilat AC LAD).  Skin: Skin is warm and dry. No rash noted.  Nursing note and vitals reviewed.     Assessment & Plan:   Problem List Items Addressed This Visit    Streptococcal pharyngitis - Primary    Given presentation anticipate strep pharyngitis despite negative RST.  Offered oral vs IM PCN - pt opts for penicillin shot. Provided with 1.2 mil U bicillin IM today in office. rec out of work until fever free x 24 hrs.  Pt agrees with plan.      Smoker    Continue to encourage cessation.       Other Visit Diagnoses    Sore throat  Relevant Orders    POCT rapid strep A (Completed)        Follow up plan: Return if symptoms worsen or fail to improve.

## 2015-11-15 NOTE — Assessment & Plan Note (Signed)
Continue to encourage cessation. 

## 2015-11-15 NOTE — Addendum Note (Signed)
Addended by: Royann Shivers A on: 11/15/2015 03:00 PM   Modules accepted: Orders

## 2015-11-19 ENCOUNTER — Ambulatory Visit: Payer: 59 | Admitting: Family Medicine

## 2015-12-30 ENCOUNTER — Ambulatory Visit (INDEPENDENT_AMBULATORY_CARE_PROVIDER_SITE_OTHER): Payer: 59 | Admitting: Family Medicine

## 2015-12-30 ENCOUNTER — Encounter: Payer: Self-pay | Admitting: Family Medicine

## 2015-12-30 ENCOUNTER — Telehealth: Payer: Self-pay

## 2015-12-30 VITALS — BP 100/70 | HR 62 | Temp 97.5°F | Wt 144.5 lb

## 2015-12-30 DIAGNOSIS — K4090 Unilateral inguinal hernia, without obstruction or gangrene, not specified as recurrent: Secondary | ICD-10-CM | POA: Diagnosis not present

## 2015-12-30 NOTE — Telephone Encounter (Signed)
PLEASE NOTE: All timestamps contained within this report are represented as [place] Standard Time. CONFIDENTIALTY NOTICE: This fax transmission is intended only for the addressee. It contains information that is legally privileged, confidential or otherwise protected from use or disclosure. If you are not the intended recipient, you are strictly prohibited from reviewing, disclosing, copying using or disseminating any of this information or taking any action in reliance on or regarding this information. If you have received this fax in error, please notify us immediately by telephone so that we can arrange for its return to [place]. Phone: [phone], Toll-Free: [phone], Fax: [phone] Page: 1 of 1 Call Id: [id number] [place] Patient Name: [name] Gender: Male DOB: Feb 08, 1991 Age: 25 Y 2 M 11 D Return Phone Number: [phone] (Primary) Address: City/State/Zip: [place] Client [place] [facility] - Client Client Site [place] Physician Diona Browner, Amy Contact Type Call Who Is Calling Patient / Member / Family / Caregiver Call Type Triage / Clinical Caller Name Andreis Manley Relationship To Patient Self Return Phone Number [phone] (Primary) Chief Complaint Hernia Symptoms Reason for Call Request to Schedule Office Appointment Initial Comment Caller stated he thinks he may have a hernia and would like to schedule an appointment. Translation No Nurse Assessment Guidelines Guideline Title Affirmed Question Affirmed Notes Nurse Date/Time (Eastern Time) Disp. Time Eilene Ghazi Time) Disposition Final User 12/29/2015 8:34:21 PM Attempt made - line busy Conner, RN, Emmaline Kluver 12/29/2015 8:47:55 PM FINAL ATTEMPT MADE - no message left Yes Conner, RN, Emmaline Kluver

## 2015-12-30 NOTE — Patient Instructions (Addendum)
[name] will call about your referral.  See her on the way out.  Aleve as needed with food for pain.  Activity and work are limited by pain.  Limit heavy lifting and straining.  Take care.  Glad to see you.

## 2015-12-30 NOTE — Progress Notes (Signed)
Pre visit review using our clinic review tool, if applicable. No additional management support is needed unless otherwise documented below in the visit note.  H/o low BP at baseline.  Not lightheaded.    possible hernia.  Noted a "weird feeling" in right inguinal area, worse with activity.  Noted yesterday.  He felt a lump, he didn't manipulate it to see if it would go back down.  No L sided sx.  Some R testicle discomfort for the last few weeks.  No other groin pain.  No FCNAVD.  He feels well o/w.    Plumber.  D/w pt about lifting.    Meds, vitals, and allergies reviewed.   ROS: See HPI.  Otherwise, noncontributory.  nad ncat rrr ctab abd soft R inguinal area with soft reducible mass that enlarges slightly with coughing.   No other masses noted.   Testicles not ttp

## 2015-12-30 NOTE — Telephone Encounter (Signed)
Pt saw Dr Damita Dunnings earlier this morning.

## 2015-12-30 NOTE — Assessment & Plan Note (Signed)
Likely, d/w pt.  See AVS.  Refer to gen surgery with routine cautions in the meantime.  Anatomy d/w pt.

## 2016-09-25 ENCOUNTER — Encounter: Payer: Self-pay | Admitting: Family Medicine

## 2016-09-25 ENCOUNTER — Ambulatory Visit (INDEPENDENT_AMBULATORY_CARE_PROVIDER_SITE_OTHER): Payer: 59 | Admitting: Family Medicine

## 2016-09-25 VITALS — BP 106/64 | HR 67 | Temp 98.3°F | Ht 66.25 in | Wt 154.2 lb

## 2016-09-25 DIAGNOSIS — J029 Acute pharyngitis, unspecified: Secondary | ICD-10-CM | POA: Diagnosis not present

## 2016-09-25 DIAGNOSIS — J020 Streptococcal pharyngitis: Secondary | ICD-10-CM | POA: Diagnosis not present

## 2016-09-25 LAB — POCT RAPID STREP A (OFFICE): RAPID STREP A SCREEN: POSITIVE — AB

## 2016-09-25 MED ORDER — AMOXICILLIN 500 MG PO CAPS: 1000.0000 mg | ORAL_CAPSULE | Freq: Two times a day (BID) | ORAL | 0 refills | Status: DC

## 2016-09-25 MED ORDER — BENZOCAINE-MENTHOL 15-10 MG MT LOZG: 1.0000 | LOZENGE | OROMUCOSAL | 0 refills | Status: DC | PRN

## 2016-09-25 NOTE — Patient Instructions (Addendum)
Complete antibiotics.  Use aleve as needed fo pain in throat.  Strep Throat Strep throat is a bacterial infection of the throat. Your health care provider may call the infection tonsillitis or pharyngitis, depending on whether there is swelling in the tonsils or at the back of the throat. Strep throat is most common during the cold months of the year in children who are 45-24 years of age, but it can happen during any season in people of any age. This infection is spread from person to person (contagious) through coughing, sneezing, or close contact. What are the causes? Strep throat is caused by the bacteria called Streptococcus pyogenes. What increases the risk? This condition is more likely to develop in:  People who spend time in crowded places where the infection can spread easily.  People who have close contact with someone who has strep throat. What are the signs or symptoms? Symptoms of this condition include:  Fever or chills.  Redness, swelling, or pain in the tonsils or throat.  Pain or difficulty when swallowing.  White or yellow spots on the tonsils or throat.  Swollen, tender glands in the neck or under the jaw.  Red rash all over the body (rare). How is this diagnosed? This condition is diagnosed by performing a rapid strep test or by taking a swab of your throat (throat culture test). Results from a rapid strep test are usually ready in a few minutes, but throat culture test results are available after one or two days. How is this treated? This condition is treated with antibiotic medicine. Follow these instructions at home: Medicines  Take over-the-counter and prescription medicines only as told by your health care provider.  Take your antibiotic as told by your health care provider. Do not stop taking the antibiotic even if you start to feel better.  Have family members who also have a sore throat or fever tested for strep throat. They may need antibiotics if  they have the strep infection. Eating and drinking  Do not share food, drinking cups, or personal items that could cause the infection to spread to other people.  If swallowing is difficult, try eating soft foods until your sore throat feels better.  Drink enough fluid to keep your urine clear or pale yellow. General instructions  Gargle with a salt-water mixture 3-4 times per day or as needed. To make a salt-water mixture, completely dissolve -1 tsp of salt in 1 cup of warm water.  Make sure that all household members wash their hands well.  Get plenty of rest.  Stay home from school or work until you have been taking antibiotics for 24 hours.  Keep all follow-up visits as told by your health care provider. This is important. Contact a health care provider if:  The glands in your neck continue to get bigger.  You develop a rash, cough, or earache.  You cough up a thick liquid that is green, yellow-brown, or bloody.  You have pain or discomfort that does not get better with medicine.  Your problems seem to be getting worse rather than better.  You have a fever. Get help right away if:  You have new symptoms, such as vomiting, severe headache, stiff or painful neck, chest pain, or shortness of breath.  You have severe throat pain, drooling, or changes in your voice.  You have swelling of the neck, or the skin on the neck becomes red and tender.  You have signs of dehydration, such as fatigue, dry  mouth, and decreased urination.  You become increasingly sleepy, or you cannot wake up completely.  Your joints become red or painful. This information is not intended to replace advice given to you by your health care provider. Make sure you discuss any questions you have with your health care provider. Document Released: 10/09/2000 Document Revised: 06/10/2016 Document Reviewed: 02/04/2015 Elsevier Interactive Patient Education  2017 [employer].

## 2016-09-25 NOTE — Progress Notes (Signed)
Pre visit review using our clinic review tool, if applicable. No additional management support is needed unless otherwise documented below in the visit note. 

## 2016-09-25 NOTE — Progress Notes (Signed)
   Subjective:    Patient ID: [name] Reason IV, male    DOB: [birth year], 25 y.o.   MRN: [id number]  Sore Throat   This is a new problem. The current episode started in the past 7 days. The problem has been gradually improving. Neither side of throat is experiencing more pain than the other. There has been no fever (low grade 99). The pain is moderate. Associated symptoms include swollen glands and trouble swallowing. Pertinent negatives include no coughing, drooling, ear discharge, ear pain, headaches, neck pain, shortness of breath or vomiting. He has had no exposure to strep or mono. Treatments tried:  using cepacol. The treatment provided mild relief.      Review of Systems  HENT: Positive for trouble swallowing. Negative for drooling, ear discharge and ear pain.   Respiratory: Negative for cough and shortness of breath.   Gastrointestinal: Negative for vomiting.  Musculoskeletal: Negative for neck pain.  Neurological: Negative for headaches.       Objective:   Physical Exam  Constitutional: Vital signs are normal. He appears well-developed and well-nourished.  HENT:  Head: Normocephalic.  Right Ear: Hearing normal.  Left Ear: Hearing normal.  Nose: Nose normal.  Mouth/Throat: Mucous membranes are normal. Oropharyngeal exudate, posterior oropharyngeal edema and posterior oropharyngeal erythema present. No tonsillar abscesses.  Scab on seputm  Neck: Trachea normal. Carotid bruit is not present. No thyroid mass and no thyromegaly present.  Cardiovascular: Normal rate, regular rhythm and normal pulses.  Exam reveals no gallop, no distant heart sounds and no friction rub.   No murmur heard. No peripheral edema  Pulmonary/Chest: Effort normal and breath sounds normal. No respiratory distress.  Skin: Skin is warm, dry and intact. No rash noted.  Psychiatric: He has a normal mood and affect. His speech is normal and behavior is normal. Thought content normal.            Assessment & Plan:

## 2016-09-25 NOTE — Assessment & Plan Note (Signed)
Complete antibiotics.  Use NSAIDs for pain and fever.

## 2017-10-18 ENCOUNTER — Telehealth: Payer: Self-pay | Admitting: Family Medicine

## 2017-10-18 ENCOUNTER — Ambulatory Visit: Payer: Self-pay | Admitting: Hematology

## 2017-10-18 NOTE — Telephone Encounter (Signed)
CRM for notification. See Telephone encounter for:   10/18/17.  Relation to pt: self Call back number: [phone]   Reason for call:  Patient states right side pain upper quadrant area and back pain for the last 2 weeks, triage nurse unsuccessful, patient declined appointment today and scheduled with Dr. Lorelei Pont for 10/20/17. Informed patient triage nurse  will follow up regarding he's symptoms.

## 2017-10-18 NOTE — Telephone Encounter (Signed)
See triage note.

## 2017-10-18 NOTE — Telephone Encounter (Signed)
Appointment already made by [facility] agent for 10/20/17 @ 11.  Home care advice and when to seek emergency care give.  Patient verbalizes understanding. Reason for Disposition . [1] MILD pain (e.g., does not interfere with normal activities) AND [2] pain comes and goes (cramps) [3] present > 48 hours  Answer Assessment - Initial Assessment Questions 1. LOCATION: "Where does it hurt?"      Right upper quad 2. RADIATION: "Does the pain shoot anywhere else?" (e.g., chest, back)     To back and shoulder 3. ONSET: "When did the pain begin?" (Minutes, hours or days ago)   2 weeks ago  4. SUDDEN: "Gradual or sudden onset?"    gradual 5. PATTERN "Does the pain come and go, or is it constant?"    - If constant: "Is it getting better, staying the same, or worsening?"      (Note: Constant means the pain never goes away completely; most serious pain is constant and it progresses)     - If intermittent: "How long does it last?" "Do you have pain now?"     (Note: Intermittent means the pain goes away completely between bouts)   Comes and goes 6. SEVERITY: "How bad is the pain?"  (e.g., Scale 1-10; mild, moderate, or severe)    - MILD (1-3): doesn't interfere with normal activities, abdomen soft and not tender to touch     - MODERATE (4-7): interferes with normal activities or awakens from sleep, tender to touch     - SEVERE (8-10): excruciating pain, doubled over, unable to do any normal activities      mild 7. RECURRENT SYMPTOM: "Have you ever had this type of abdominal pain before?" If so, ask: "When was the last time?" and "What happened that time?"    Usually happens after he eats 8. CAUSE: "What do you think is causing the abdominal pain?"   gallbladder 9. RELIEVING/AGGRAVATING FACTORS: "What makes it better or worse?" (e.g., movement, antacids, bowel movement)  took prevacid, and it helped his acid refulx 10. OTHER SYMPTOMS: "Has there been any vomiting, diarrhea, constipation, or urine problems?"       No nausea, soft stools, no problems peeing  Protocols used: ABDOMINAL PAIN - MALE-A-AH

## 2017-10-20 ENCOUNTER — Encounter: Payer: Self-pay | Admitting: Family Medicine

## 2017-10-20 ENCOUNTER — Other Ambulatory Visit: Payer: Self-pay

## 2017-10-20 ENCOUNTER — Ambulatory Visit (INDEPENDENT_AMBULATORY_CARE_PROVIDER_SITE_OTHER): Payer: 59 | Admitting: Family Medicine

## 2017-10-20 VITALS — BP 100/66 | HR 75 | Temp 98.4°F | Ht 67.0 in | Wt 151.2 lb

## 2017-10-20 DIAGNOSIS — K219 Gastro-esophageal reflux disease without esophagitis: Secondary | ICD-10-CM | POA: Diagnosis not present

## 2017-10-20 DIAGNOSIS — R1011 Right upper quadrant pain: Secondary | ICD-10-CM | POA: Diagnosis not present

## 2017-10-20 LAB — HEPATIC FUNCTION PANEL
ALT: 39 U/L (ref 0–53)
AST: 28 U/L (ref 0–37)
Albumin: 4.7 g/dL (ref 3.5–5.2)
Alkaline Phosphatase: 75 U/L (ref 39–117)
BILIRUBIN DIRECT: 0.1 mg/dL (ref 0.0–0.3)
BILIRUBIN TOTAL: 0.6 mg/dL (ref 0.2–1.2)
Total Protein: 7.5 g/dL (ref 6.0–8.3)

## 2017-10-20 LAB — CBC WITH DIFFERENTIAL/PLATELET
BASOS PCT: 0.2 % (ref 0.0–3.0)
Basophils Absolute: 0 10*3/uL (ref 0.0–0.1)
EOS ABS: 0.1 10*3/uL (ref 0.0–0.7)
Eosinophils Relative: 1.8 % (ref 0.0–5.0)
HEMATOCRIT: 49 % (ref 39.0–52.0)
Hemoglobin: 16.5 g/dL (ref 13.0–17.0)
LYMPHS ABS: 2.5 10*3/uL (ref 0.7–4.0)
LYMPHS PCT: 33 % (ref 12.0–46.0)
MCHC: 33.7 g/dL (ref 30.0–36.0)
MCV: 90.7 fl (ref 78.0–100.0)
MONO ABS: 0.7 10*3/uL (ref 0.1–1.0)
Monocytes Relative: 9.3 % (ref 3.0–12.0)
NEUTROS ABS: 4.2 10*3/uL (ref 1.4–7.7)
NEUTROS PCT: 55.7 % (ref 43.0–77.0)
PLATELETS: 279 10*3/uL (ref 150.0–400.0)
RBC: 5.4 Mil/uL (ref 4.22–5.81)
RDW: 12.3 % (ref 11.5–15.5)
WBC: 7.6 10*3/uL (ref 4.0–10.5)

## 2017-10-20 LAB — BASIC METABOLIC PANEL
BUN: 13 mg/dL (ref 6–23)
CO2: 29 mEq/L (ref 19–32)
Calcium: 9.6 mg/dL (ref 8.4–10.5)
Chloride: 101 mEq/L (ref 96–112)
Creatinine, Ser: 0.98 mg/dL (ref 0.40–1.50)
GFR: 99.04 mL/min (ref >60.00)
Glucose, Bld: 85 mg/dL (ref 70–99)
POTASSIUM: 3.9 meq/L (ref 3.5–5.1)
Sodium: 137 mEq/L (ref 135–145)

## 2017-10-20 LAB — LIPASE: LIPASE: 10 U/L — AB (ref 11.0–59.0)

## 2017-10-20 LAB — H. PYLORI ANTIBODY, IGG: H PYLORI IGG: NEGATIVE

## 2017-10-20 NOTE — Progress Notes (Signed)
Dr. Frederico Hamman T. Arzella Rehmann, MD, [facility] [place], [address] Phone: [phone] Fax: [phone]  10/20/2017  Patient: [name], MRN: [id number], DOB: [birth year], 26 y.o.  Primary Physician:  Jinny Sanders, MD   Chief Complaint  Patient presents with  . Beatrix Shipper Pain   Subjective:   [name] is a 26 y.o. very pleasant male patient who presents with the following:  Dull pain in the upper right section between the abdomen and her torso. Felt like a bag of sand under his ribs and hurting a little bit more. Had some pain in the shoulder and in the armpit. Has been ongoing for more than a couple of weeks.   Had some clay stools.   He also does think that he has some reflux, worse by fatty foods, and he also gets routinely some acidic taste in his mouth.  He has had some burning in his throat.  He did try some Prevacid for 2 weeks, and this did not seem to help.  Past Medical History, Surgical History, Social History, Family History, Problem List, Medications, and Allergies have been reviewed and updated if relevant.  Patient Active Problem List   Diagnosis Date Noted  . Right inguinal hernia 12/30/2015  . Strep pharyngitis 11/15/2015  . Smoker 11/15/2015  . Dermoid cyst of left ear 10/08/2015  . Infected cyst of skin, right earlobe 10/08/2015  . Acute bronchitis 12/18/2014  . Painful bladder spasm 12/18/2014  . ETD (eustachian tube dysfunction) 01/17/2014  . Ulnar nerve compression 01/17/2014  . Palpitations 06/13/2013  . Panic attacks 06/13/2013  . Hyperostosis 06/07/2013  . Chest wall deformity 06/07/2013  . Classic migraine with aura 01/19/2011  . Verrucae vulgaris 01/19/2011  . SHORTNESS OF BREATH 03/05/2009  . INGROWN TOENAIL 01/16/2009  . ACNE VULGARIS 01/16/2009  . ANXIETY STATE NEC 06/28/2007  . DEFORMITY, SPINE, CONGENITAL 06/28/2007  . SYMPTOM, PAIN, ABDOMINAL, GENERALIZED  06/28/2007    Past Medical History:  Diagnosis Date  . Abdominal pain, generalized   . Congenital musculoskeletal deformity of spine   . Headache(784.0)   . Ingrowing nail   . Other acne   . Other anxiety states   . Shortness of breath     History reviewed. No pertinent surgical history.  Social History   Socioeconomic History  . Marital status: Single    Spouse name: Not on file  . Number of children: Not on file  . Years of education: Not on file  . Highest education level: Not on file  Social Needs  . Financial resource strain: Not on file  . Food insecurity - worry: Not on file  . Food insecurity - inability: Not on file  . Transportation needs - medical: Not on file  . Transportation needs - non-medical: Not on file  Occupational History  . Not on file  Tobacco Use  . Smoking status: Former Smoker    Types: Cigarettes  . Smokeless tobacco: Former Network engineer and Sexual Activity  . Alcohol use: Yes    Comment: occasional  . Drug use: Yes    Comment: MJ-regularly  . Sexual activity: Not on file  Other Topics Concern  . Not on file  Social History Narrative  . Not on file    History reviewed. No pertinent family history.  Allergies  Allergen Reactions  . Ibuprofen     Stomach upset, headaches - does ok with aleve  Medication list reviewed and updated in full in [place].   GEN: No acute illnesses, no fevers, chills. GI: No n/v/d, eating normally Pulm: No SOB Interactive and getting along well at home.  Otherwise, ROS is as per the HPI.  Objective:   BP 100/66   Pulse 75   Temp 98.4 F (36.9 C) (Oral)   Ht 5\' 7"  (1.702 m)   Wt 151 lb 4 oz (68.6 kg)   BMI 23.69 kg/m   GEN: WDWN, NAD, Non-toxic, A & O x 3 HEENT: Atraumatic, Normocephalic. Neck supple. No masses, No LAD. Ears and Nose: No external deformity. CV: RRR, No M/G/R. No JVD. No thrill. No extra heart sounds. PULM: CTA B, no wheezes, crackles, rhonchi. No  retractions. No resp. distress. No accessory muscle use. ABD: S, mild RUQ pain, ND, + BS, No rebound, No HSM  EXTR: No c/c/e NEURO Normal gait.  PSYCH: Normally interactive. Conversant. Not depressed or anxious appearing.  Calm demeanor.   Laboratory and Imaging Data:  Assessment and Plan:   Right upper quadrant pain - Plan: CBC with Differential/Platelet, Basic metabolic panel, Hepatic function panel, Lipase, H. pylori antibody, IgG, US Abdomen Limited RUQ  Gastroesophageal reflux disease, esophagitis presence not specified  Patient abdominal pain workup, given the patient's right upper quadrant pain after eating as well as on exam, check right upper quadrant ultrasound.  Certainly he describes symptoms consistent with reflux also.  Follow-up: Return in about 4 weeks (around 11/17/2017).  Future Appointments  Date Time Provider [place]  10/22/2017  8:45 AM [facility]   Orders Placed This Encounter  Procedures  . US Abdomen Limited RUQ  . CBC with Differential/Platelet  . Basic metabolic panel  . Hepatic function panel  . Lipase  . H. pylori antibody, IgG    Signed,  Kemond Amorin T. Deyvi Bonanno, MD   Allergies as of 10/20/2017      Reactions   Ibuprofen    Stomach upset, headaches - does ok with aleve      Medication List        Accurate as of 10/20/17 12:07 PM. Always use your most recent med list.          naproxen sodium 220 MG tablet Commonly known as:  ALEVE Take 220 mg by mouth 2 (two) times daily with a meal. As needed for migraines.   SINEX REGULAR NA Place into the nose.

## 2017-10-22 ENCOUNTER — Ambulatory Visit
Admission: RE | Admit: 2017-10-22 | Discharge: 2017-10-22 | Disposition: A | Discharge status: Home / Self Care | Payer: 59 | Source: Ambulatory Visit | Attending: Family Medicine | Admitting: Family Medicine

## 2017-10-22 ENCOUNTER — Telehealth: Payer: Self-pay | Admitting: *Deleted

## 2017-10-22 DIAGNOSIS — R1011 Right upper quadrant pain: Secondary | ICD-10-CM | POA: Diagnosis not present

## 2017-10-22 MED ORDER — OMEPRAZOLE 20 MG PO CPDR: 20.0000 mg | DELAYED_RELEASE_CAPSULE | Freq: Two times a day (BID) | ORAL | 3 refills | Status: DC

## 2017-10-22 NOTE — Telephone Encounter (Signed)
-----   Message from Owens Loffler, MD sent at 10/22/2017  4:58 PM EST ----- Call  us came back completely normal.   I think everything is coming from Reflux.  Let's start - keep this up at least 2 months Omeprazole 20 mg, 1 po bid, #60, 3 ref

## 2017-10-22 NOTE — Telephone Encounter (Signed)
Mr. Tonkinson notified as instructed by telephone.  Omeprazole 20 mg prescription sent into [facility] in [place] as instructed by Dr. Lorelei Pont.

## 2018-05-08 DIAGNOSIS — M25561 Pain in right knee: Secondary | ICD-10-CM | POA: Diagnosis not present

## 2018-05-09 ENCOUNTER — Ambulatory Visit (INDEPENDENT_AMBULATORY_CARE_PROVIDER_SITE_OTHER): Payer: 59 | Admitting: Family Medicine

## 2018-05-09 ENCOUNTER — Encounter: Payer: Self-pay | Admitting: Family Medicine

## 2018-05-09 ENCOUNTER — Other Ambulatory Visit: Payer: Self-pay | Admitting: Family Medicine

## 2018-05-09 VITALS — BP 108/68 | HR 82 | Temp 98.5°F | Ht 67.0 in | Wt 147.5 lb

## 2018-05-09 DIAGNOSIS — M2391 Unspecified internal derangement of right knee: Secondary | ICD-10-CM

## 2018-05-09 DIAGNOSIS — T1590XA Foreign body on external eye, part unspecified, unspecified eye, initial encounter: Secondary | ICD-10-CM

## 2018-05-09 DIAGNOSIS — M25569 Pain in unspecified knee: Secondary | ICD-10-CM

## 2018-05-09 NOTE — Patient Instructions (Signed)
REFERRALS TO SPECIALISTS, SPECIAL TESTS (MRI, CT, ULTRASOUNDS)  MARION or  Anastasiya will help you. ASK CHECK-IN FOR HELP.  Specialist appointment times vary a great deal, based on their schedule / openings. -- Some specialists have very long wait times. (Example. Dermatology)    

## 2018-05-09 NOTE — Progress Notes (Signed)
Dr. Frederico Hamman T. Eilene Voigt, MD, [facility] [place], [address] Phone: [phone] Fax: [phone]  05/09/2018  Patient: [name], MRN: [id number], DOB: Oct 30, 1990, 27 y.o.  Primary Physician:  Jinny Sanders, MD   Chief Complaint  Patient presents with  . Knee Pain    right, went to urgent care yesterday [facility]   Subjective:   [name] is a 27 y.o. very pleasant male patient who presents with the following:  Acute R knee pain: The patient is present in examination room with a DonJoy playmaker brace on, and is also using crutches for ambulation and he is nonweightbearing.  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  He is having mechanical symptoms.  His knee is getting locked up a significant amount of time and he is having to go and sit in a hot bath to gently try to extend his knee.  He has had mechanical giving way.  He has been nonweightbearing since the time of injury.  He is having most of his pain medially.  At baseline he does not have any significant health problems.  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  XR  Unable to extend R knee.   Past Medical History, Surgical History, Social History, Family History, Problem List, Medications, and Allergies have been reviewed and updated if relevant.  Patient Active Problem List   Diagnosis Date Noted  . Right inguinal hernia 12/30/2015  . Strep pharyngitis 11/15/2015  . Smoker 11/15/2015  . Dermoid cyst of left ear 10/08/2015  . Infected cyst of skin, right earlobe 10/08/2015  . Acute bronchitis 12/18/2014  . Painful bladder spasm 12/18/2014  . ETD  (eustachian tube dysfunction) 01/17/2014  . Ulnar nerve compression 01/17/2014  . Palpitations 06/13/2013  . Panic attacks 06/13/2013  . Hyperostosis 06/07/2013  . Chest wall deformity 06/07/2013  . Classic migraine with aura 01/19/2011  . Verrucae vulgaris 01/19/2011  . SHORTNESS OF BREATH 03/05/2009  . INGROWN TOENAIL 01/16/2009  . ACNE VULGARIS 01/16/2009  . ANXIETY STATE NEC 06/28/2007  . DEFORMITY, SPINE, CONGENITAL 06/28/2007  . SYMPTOM, PAIN, ABDOMINAL, GENERALIZED 06/28/2007    Past Medical History:  Diagnosis Date  . Abdominal pain, generalized   . Congenital musculoskeletal deformity of spine   . Headache(784.0)   . Ingrowing nail   . Other acne   . Other anxiety states   . Shortness of breath     No past surgical history on file.  Social History   Socioeconomic History  . Marital status: Single    Spouse name: Not on file  . Number of children: Not on file  . Years of education: Not on file  . Highest education level: Not on file  Occupational History  . Not on file  Social Needs  . Financial resource strain: Not on file  . Food insecurity:    Worry: Not on file    Inability: Not on file  . Transportation needs:    Medical: Not on file    Non-medical: Not on file  Tobacco Use  . Smoking status: Former  Smoker    Types: Cigarettes  . Smokeless tobacco: Former Network engineer and Sexual Activity  . Alcohol use: Yes    Comment: occasional  . Drug use: Yes    Comment: MJ-regularly  . Sexual activity: Not on file  Lifestyle  . Physical activity:    Days per week: Not on file    Minutes per session: Not on file  . Stress: Not on file  Relationships  . Social connections:    Talks on phone: Not on file    Gets together: Not on file    Attends religious service: Not on file    Active member of club or organization: Not on file    Attends meetings of clubs or organizations: Not on file    Relationship status: Not on file  . Intimate partner  violence:    Fear of current or ex partner: Not on file    Emotionally abused: Not on file    Physically abused: Not on file    Forced sexual activity: Not on file  Other Topics Concern  . Not on file  Social History Narrative  . Not on file    No family history on file.  Allergies  Allergen Reactions  . Ibuprofen     Stomach upset, headaches - does ok with aleve    Medication list reviewed and updated in full in [place].  GEN: No fevers, chills. Nontoxic. Primarily MSK c/o today. MSK: Detailed in the HPI GI: tolerating PO intake without difficulty Neuro: No numbness, parasthesias, or tingling associated. Otherwise the pertinent positives of the ROS are noted above.   Objective:   BP 108/68   Pulse 82   Temp 98.5 F (36.9 C) (Oral)   Ht 5\' 7"  (1.702 m)   Wt 147 lb 8 oz (66.9 kg)   BMI 23.10 kg/m    GEN: WDWN, NAD, Non-toxic, Alert & Oriented x 3 HEENT: Atraumatic, Normocephalic.  Ears and Nose: No external deformity. EXTR: No clubbing/cyanosis/edema NEURO: Normal gait.  PSYCH: Normally interactive. Conversant. Not depressed or anxious appearing.  Calm demeanor.    Right knee: There is some tenderness on the inferomedial aspect of the knee around the region of the tibial plateau.  Patient also has some tenderness notably on the medial joint line without any significant lateral joint line tenderness.  He is stable to varus and valgus stress.  Lachman is negative.  Anterior drawer and posterior drawer is negative.  McMurray's test is positive.  Flexion pinch is positive.  Bounce home test is positive. Patient does have a mild effusion.  There is no patellar apprehension.  Radiology: Taken at [facility]: I do not have them from a independent review, but they are reportedly normal without any fracture.   Assessment and Plan:   Acute internal derangement of right knee  Knee pain, unspecified chronicity, unspecified laterality - Plan: MR Knee Right Wo  Contrast  Concerning history of knee examination.  Differential diagnosis would be large-scale medial meniscal tear causing mechanical symptoms, locking up of the knee such as a bucket-handle meniscal tear versus occult tibial plateau fracture.  Obtain a MRI of the right knee to evaluate for both a large-scale impairing bucket-handle meniscal tear versus tibial plateau fracture.  Plain films have been normal reportedly.  We will attempt to obtain records.  For now, keep the patient nonweightbearing on crutches and continue with his playmaker brace.  In either case, definitive diagnosis is quite important.  If the patient  does have a large-scale meniscal tear causing functional impairment in his knee, he would benefit unable to work, and at age 22 the standard of care is surgical repair.  Follow-up: based on advanced imaging.  Orders Placed This Encounter  Procedures  . MR Knee Right Wo Contrast    Signed,  Rasha Ibe T. Shai Mckenzie, MD   Allergies as of 05/09/2018      Reactions   Ibuprofen    Stomach upset, headaches - does ok with aleve      Medication List        Accurate as of 05/09/18 11:59 PM. Always use your most recent med list.          naproxen sodium 220 MG tablet Commonly known as:  ALEVE Take 220 mg by mouth 2 (two) times daily with a meal. As needed for migraines.   omeprazole 20 MG capsule Commonly known as:  PRILOSEC Take 1 capsule (20 mg total) by mouth 2 (two) times daily before a meal.

## 2018-05-10 ENCOUNTER — Encounter: Payer: Self-pay | Admitting: Family Medicine

## 2018-05-12 ENCOUNTER — Ambulatory Visit
Admission: RE | Admit: 2018-05-12 | Discharge: 2018-05-12 | Disposition: A | Discharge status: Home / Self Care | Payer: 59 | Source: Ambulatory Visit | Attending: Family Medicine | Admitting: Family Medicine

## 2018-05-12 DIAGNOSIS — M25569 Pain in unspecified knee: Secondary | ICD-10-CM

## 2018-05-12 DIAGNOSIS — Z135 Encounter for screening for eye and ear disorders: Secondary | ICD-10-CM | POA: Diagnosis not present

## 2018-05-12 DIAGNOSIS — S83211A Bucket-handle tear of medial meniscus, current injury, right knee, initial encounter: Secondary | ICD-10-CM | POA: Diagnosis not present

## 2018-05-12 DIAGNOSIS — T1590XA Foreign body on external eye, part unspecified, unspecified eye, initial encounter: Secondary | ICD-10-CM

## 2018-05-13 ENCOUNTER — Other Ambulatory Visit: Payer: Self-pay | Admitting: Family Medicine

## 2018-05-13 DIAGNOSIS — S83219A Bucket-handle tear of medial meniscus, current injury, unspecified knee, initial encounter: Secondary | ICD-10-CM

## 2018-05-17 DIAGNOSIS — S83211A Bucket-handle tear of medial meniscus, current injury, right knee, initial encounter: Secondary | ICD-10-CM | POA: Diagnosis not present

## 2018-05-25 DIAGNOSIS — G8918 Other acute postprocedural pain: Secondary | ICD-10-CM | POA: Diagnosis not present

## 2018-05-25 DIAGNOSIS — S83231A Complex tear of medial meniscus, current injury, right knee, initial encounter: Secondary | ICD-10-CM | POA: Diagnosis not present

## 2018-05-25 DIAGNOSIS — M659 Synovitis and tenosynovitis, unspecified: Secondary | ICD-10-CM | POA: Diagnosis not present

## 2018-05-25 DIAGNOSIS — M6751 Plica syndrome, right knee: Secondary | ICD-10-CM | POA: Diagnosis not present

## 2018-06-06 DIAGNOSIS — S83241D Other tear of medial meniscus, current injury, right knee, subsequent encounter: Secondary | ICD-10-CM | POA: Diagnosis not present

## 2018-06-08 DIAGNOSIS — S83241D Other tear of medial meniscus, current injury, right knee, subsequent encounter: Secondary | ICD-10-CM | POA: Diagnosis not present

## 2018-06-13 DIAGNOSIS — S83241D Other tear of medial meniscus, current injury, right knee, subsequent encounter: Secondary | ICD-10-CM | POA: Diagnosis not present

## 2018-06-16 DIAGNOSIS — S83241D Other tear of medial meniscus, current injury, right knee, subsequent encounter: Secondary | ICD-10-CM | POA: Diagnosis not present

## 2018-06-20 DIAGNOSIS — S83241D Other tear of medial meniscus, current injury, right knee, subsequent encounter: Secondary | ICD-10-CM | POA: Diagnosis not present

## 2018-06-23 DIAGNOSIS — S83241D Other tear of medial meniscus, current injury, right knee, subsequent encounter: Secondary | ICD-10-CM | POA: Diagnosis not present

## 2018-06-29 DIAGNOSIS — S83241D Other tear of medial meniscus, current injury, right knee, subsequent encounter: Secondary | ICD-10-CM | POA: Diagnosis not present

## 2018-07-01 DIAGNOSIS — S83241D Other tear of medial meniscus, current injury, right knee, subsequent encounter: Secondary | ICD-10-CM | POA: Diagnosis not present

## 2018-07-08 DIAGNOSIS — S83241D Other tear of medial meniscus, current injury, right knee, subsequent encounter: Secondary | ICD-10-CM | POA: Diagnosis not present

## 2018-07-12 DIAGNOSIS — S83241D Other tear of medial meniscus, current injury, right knee, subsequent encounter: Secondary | ICD-10-CM | POA: Diagnosis not present

## 2018-07-14 DIAGNOSIS — S83241D Other tear of medial meniscus, current injury, right knee, subsequent encounter: Secondary | ICD-10-CM | POA: Diagnosis not present

## 2018-07-19 DIAGNOSIS — S83241D Other tear of medial meniscus, current injury, right knee, subsequent encounter: Secondary | ICD-10-CM | POA: Diagnosis not present

## 2018-08-23 ENCOUNTER — Encounter: Payer: Self-pay | Admitting: Family Medicine

## 2018-08-23 ENCOUNTER — Ambulatory Visit (INDEPENDENT_AMBULATORY_CARE_PROVIDER_SITE_OTHER): Payer: 59 | Admitting: Family Medicine

## 2018-08-23 DIAGNOSIS — L237 Allergic contact dermatitis due to plants, except food: Secondary | ICD-10-CM | POA: Diagnosis not present

## 2018-08-23 DIAGNOSIS — R300 Dysuria: Secondary | ICD-10-CM

## 2018-08-23 DIAGNOSIS — Q559 Congenital malformation of male genital organ, unspecified: Secondary | ICD-10-CM

## 2018-08-23 LAB — POC URINALSYSI DIPSTICK (AUTOMATED)
BILIRUBIN UA: NEGATIVE
Glucose, UA: NEGATIVE
Ketones, UA: NEGATIVE
Leukocytes, UA: NEGATIVE
NITRITE UA: NEGATIVE
PH UA: 6 (ref 5.0–8.0)
Protein, UA: NEGATIVE
RBC UA: NEGATIVE
Spec Grav, UA: 1.01 (ref 1.010–1.025)
Urobilinogen, UA: 0.2 E.U./dL

## 2018-08-23 MED ORDER — TRIAMCINOLONE ACETONIDE 0.5 % EX OINT: 1.0000 "application " | TOPICAL_OINTMENT | Freq: Two times a day (BID) | CUTANEOUS | 0 refills | Status: DC

## 2018-08-23 NOTE — Patient Instructions (Signed)
Apply topical steroid cream to rash twice daily as needed.  Call if testicular issue not resolving or if urinary burning returns.

## 2018-08-23 NOTE — Addendum Note (Signed)
Addended by: Carter Kitten on: 08/23/2018 08:06 AM   Modules accepted: Orders

## 2018-08-23 NOTE — Assessment & Plan Note (Signed)
Resolving.. Treat with topical steroid cream.

## 2018-08-23 NOTE — Assessment & Plan Note (Signed)
Eval for UTI. Pt refuses STD testing at this time.

## 2018-08-23 NOTE — Progress Notes (Signed)
   Subjective:    Patient ID: [name] Reason IV, male    DOB: 1991/05/10, 27 y.o.   MRN: [id number]  HPI   27 year old male presents to clinic today for new onset rash and concerns about genitals.  He reports new onset rash in last week.  Scratches and blisters on arms. He was expisode working outside.  Very itchy last night. Took some benadryl and using clamine lotion.Marland Kitchen Helps some but not much.  In last few weeks he has noted pressure/heaviness in right testicle, no mass. Mild low abd pain. No tenderness. No fever.  No injury or fall.  No N/V. Mild dysuria with urination on time.   he is sexually active.. No new partners of known exposures.  he has very low concern of STDs in his opinion.      Review of Systems  Constitutional: Negative for fatigue and fever.  HENT: Negative for ear pain.   Eyes: Negative for pain.  Respiratory: Negative for cough and shortness of breath.   Cardiovascular: Negative for chest pain, palpitations and leg swelling.  Gastrointestinal: Negative for abdominal pain.  Genitourinary: Positive for dysuria. Negative for discharge, penile pain, penile swelling, scrotal swelling, testicular pain and urgency.  Musculoskeletal: Negative for arthralgias.  Neurological: Negative for syncope, light-headedness and headaches.  Psychiatric/Behavioral: Negative for dysphoric mood.       Objective:   Physical Exam  Constitutional: Vital signs are normal. He appears well-developed and well-nourished.  HENT:  Head: Normocephalic.  Right Ear: Hearing normal.  Left Ear: Hearing normal.  Nose: Nose normal.  Mouth/Throat: Oropharynx is clear and moist and mucous membranes are normal.  Neck: Trachea normal. Carotid bruit is not present. No thyroid mass and no thyromegaly present.  Cardiovascular: Normal rate, regular rhythm and normal pulses. Exam reveals no gallop, no distant heart sounds and no friction rub.  No murmur heard. No peripheral edema    Pulmonary/Chest: Effort normal and breath sounds normal. No respiratory distress.  Abdominal: Hernia confirmed negative in the right inguinal area and confirmed negative in the left inguinal area.  Genitourinary: Testes normal and penis normal. Right testis shows no mass, no swelling and no tenderness. Left testis shows no mass, no swelling and no tenderness. Circumcised. No hypospadias, penile erythema or penile tenderness. No discharge found.  Lymphadenopathy: No inguinal adenopathy noted on the right or left side.  Skin: Skin is warm, dry and intact. No rash noted.   Excoriations on arms, blisters on fingers, palms and arms in linear pattern.  Psychiatric: He has a normal mood and affect. His speech is normal and behavior is normal. Thought content normal.          Assessment & Plan:

## 2018-08-23 NOTE — Assessment & Plan Note (Signed)
No asymmetry noted on exam...  He refuses [place] of testicles and STD testing, but will contact us if pressure or difference persists.

## 2019-09-22 IMAGING — MR MR KNEE*R* W/O CM
4 of 7 series · 19 of 40 positions shown · non-contrast
Comparison: None.

CLINICAL DATA: Injured knee 5 days ago. Persistent pain and
swelling.

EXAM:
MRI OF THE RIGHT KNEE WITHOUT CONTRAST
TECHNIQUE: Multiplanar, multisequence MR imaging of the knee was performed. No
intravenous contrast was administered.

[Series 5: T2 fat-sat · coronal · 4.0mm · 0.29mm/px · 3 of 26 slices shown]
[im 6/26]
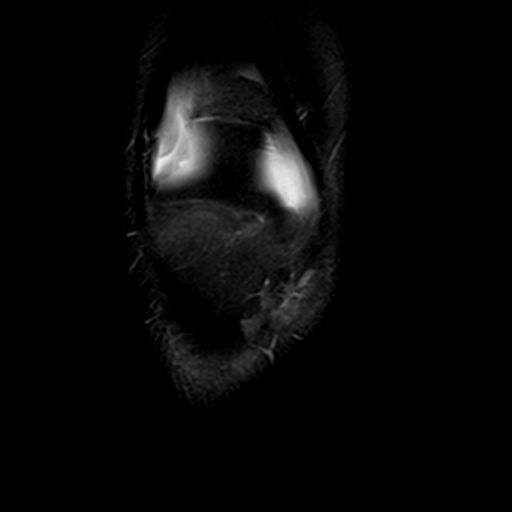
[im 16/26]
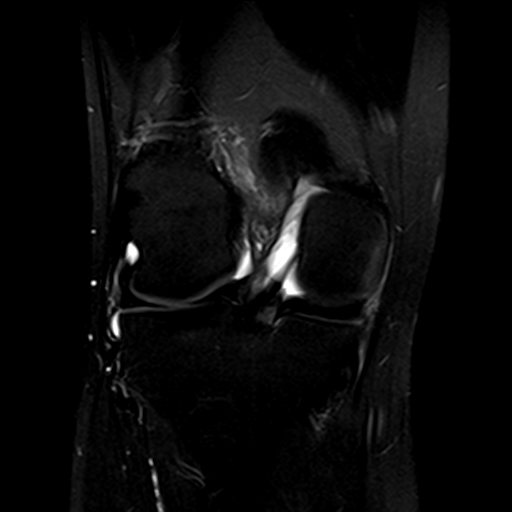
[im 26/26]
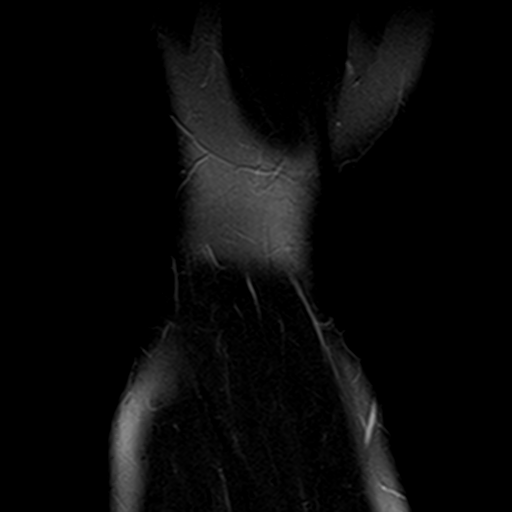

[Series 6: PD fat-sat · coronal · 3.0mm · 0.29mm/px · 7 of 32 slices shown (1 of 3)]
[im 1/32]
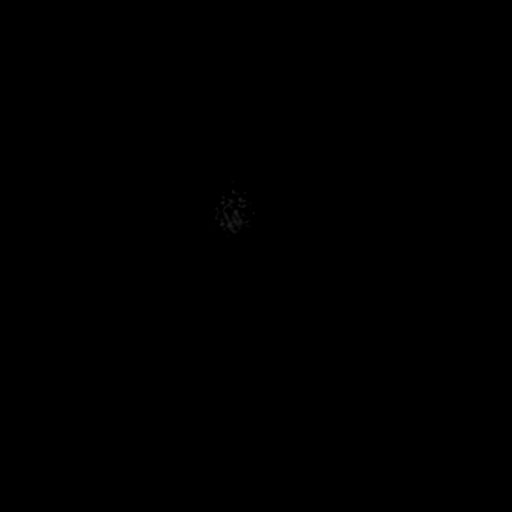
[im 6/32]
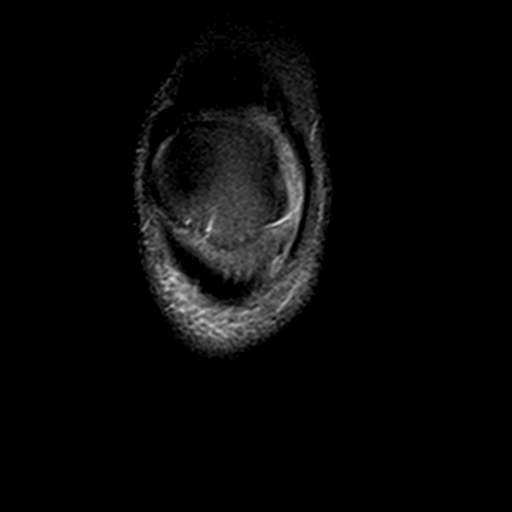
[im 11/32]
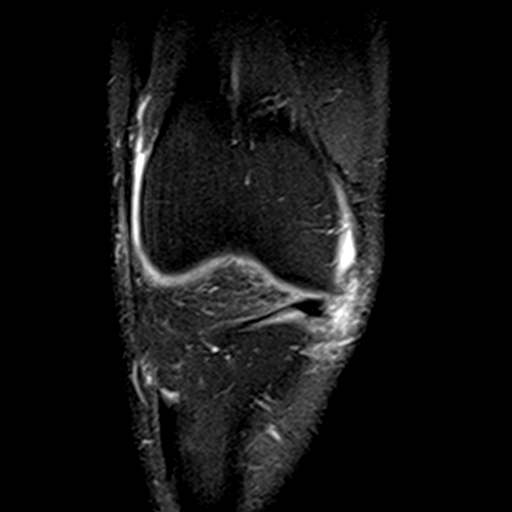
[im 16/32]
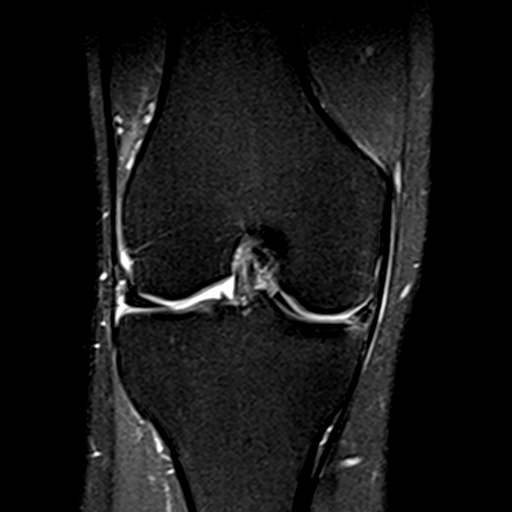
[im 21/32]
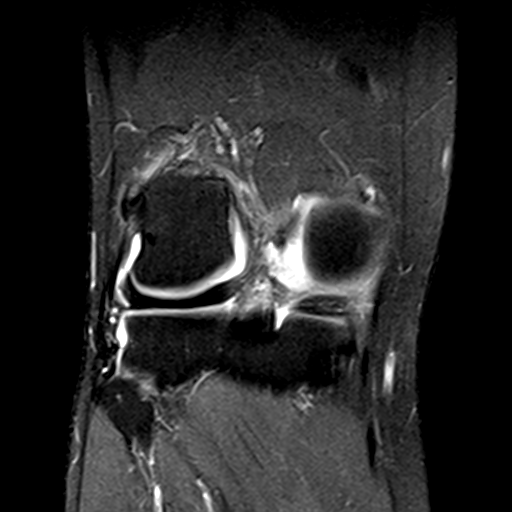
[im 26/32]
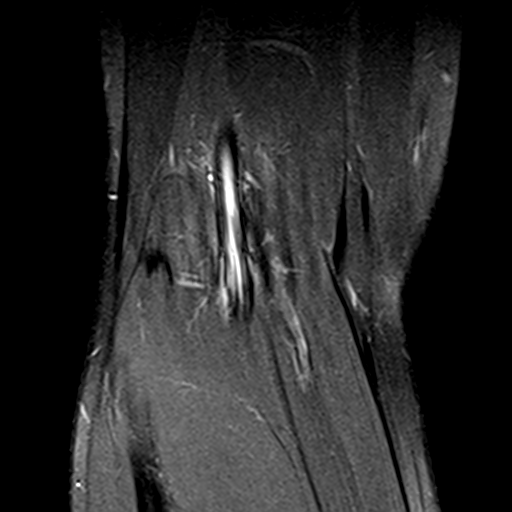
[im 32/32]
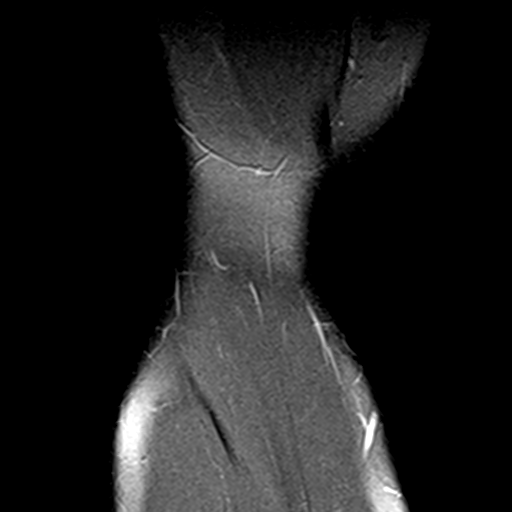

[Series 8: PD fat-sat · sagittal · 3.0mm · 0.29mm/px · 6 of 27 slices shown (2 of 3)]
[im 1/27]
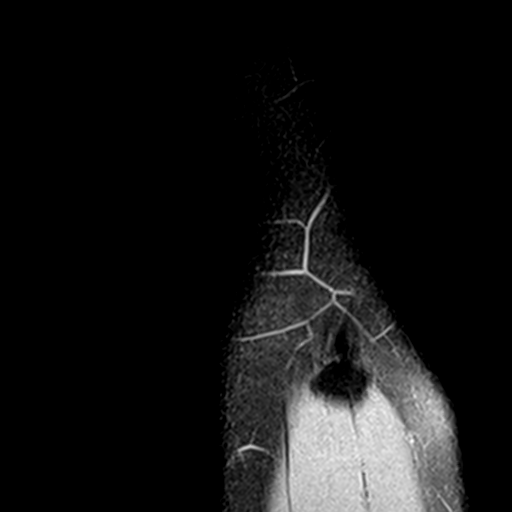
[im 6/27]
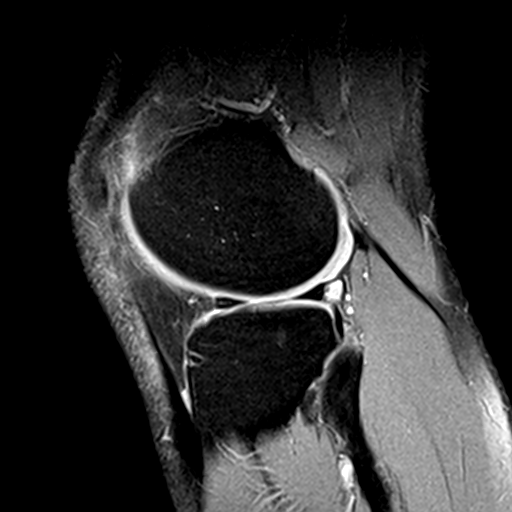
[im 11/27]
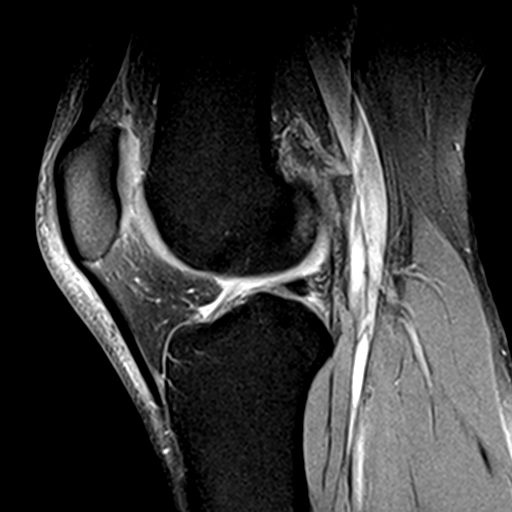
[im 16/27]
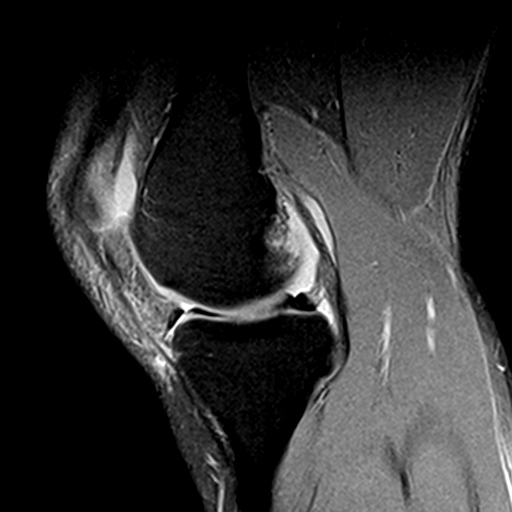
[im 21/27]
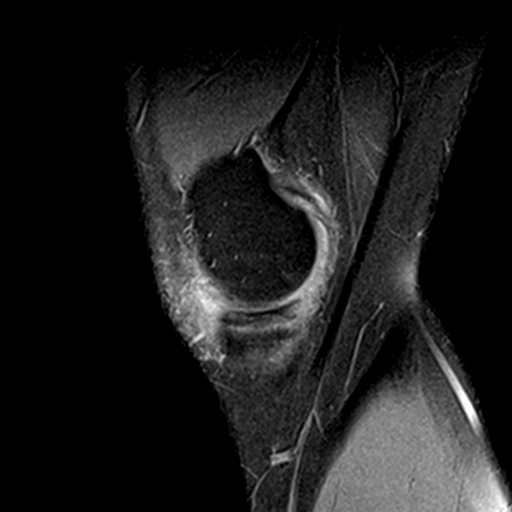
[im 27/27]
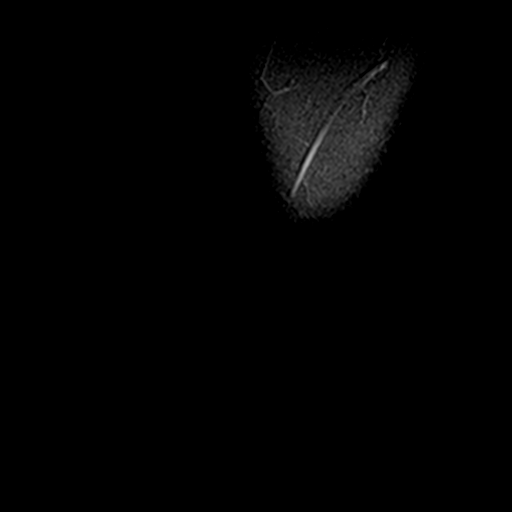

[Series 9: PD fat-sat · oblique · 2.0mm · 0.29mm/px · 3 of 13 slices shown (3 of 3)]
[im 1/13]
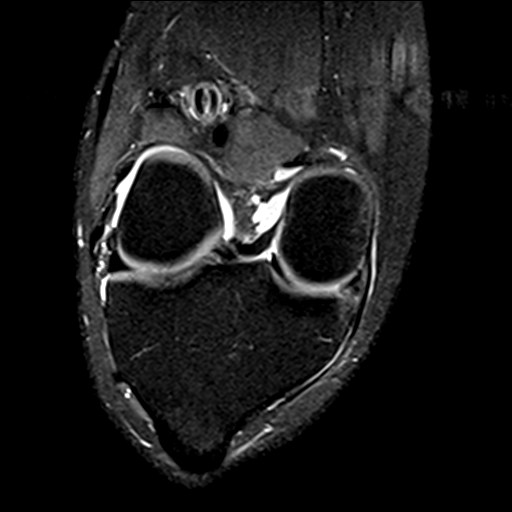
[im 7/13]
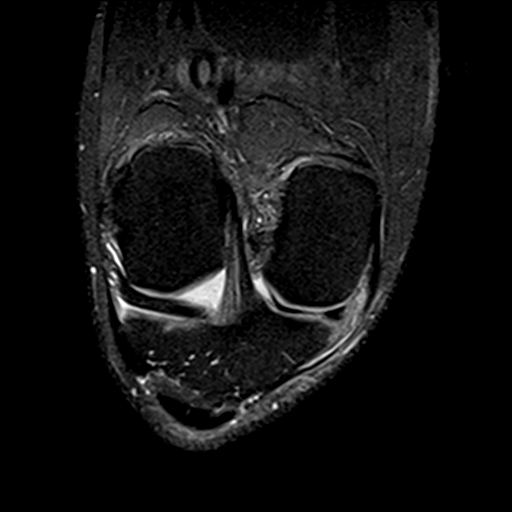
[im 13/13]
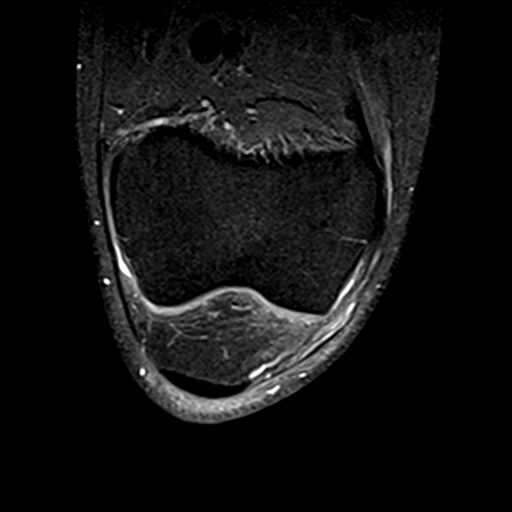

[19 of 40 positions shown; findings below may reference images not displayed]

FINDINGS: MENISCI

Medial meniscus: There is a bucket-handle type tear involving the
meniscus with a sizable bucket-handle fragment in the central joint
space.

Lateral meniscus:  Intact

LIGAMENTS

Cruciates:  Intact

Collaterals:  Intact

CARTILAGE

Patellofemoral:  Normal

Medial:  Normal

Lateral:  Normal

Joint:  Small joint effusion.

Popliteal Fossa:  No popliteal mass or Baker's cyst.

Extensor Mechanism: The patella retinacular structures are intact
and the quadriceps and patellar tendons are intact.

Bones: Mild reactive marrow edema in the medial aspect of the medial
femoral condyle and medial tibial plateau likely related to the
meniscus tear.

Other: Normal knee musculature.
IMPRESSION: 1. Bucket-handle type medial meniscus tear.
2. Intact ligamentous structures and no significant bony findings.
Mild reactive edema noted in the medial tibia and femur.
3. Normal articular cartilage.
4. Small joint effusion.

## 2019-09-22 IMAGING — CR DG ORBITS FOR FOREIGN BODY
2 series · 2 of 2 positions shown · non-contrast
Comparison: CT 02/24/2009.

CLINICAL DATA: History of metal exposure.

EXAM:
ORBITS FOR FOREIGN BODY - 2 VIEW

[w orbit pa (1 of 2)]
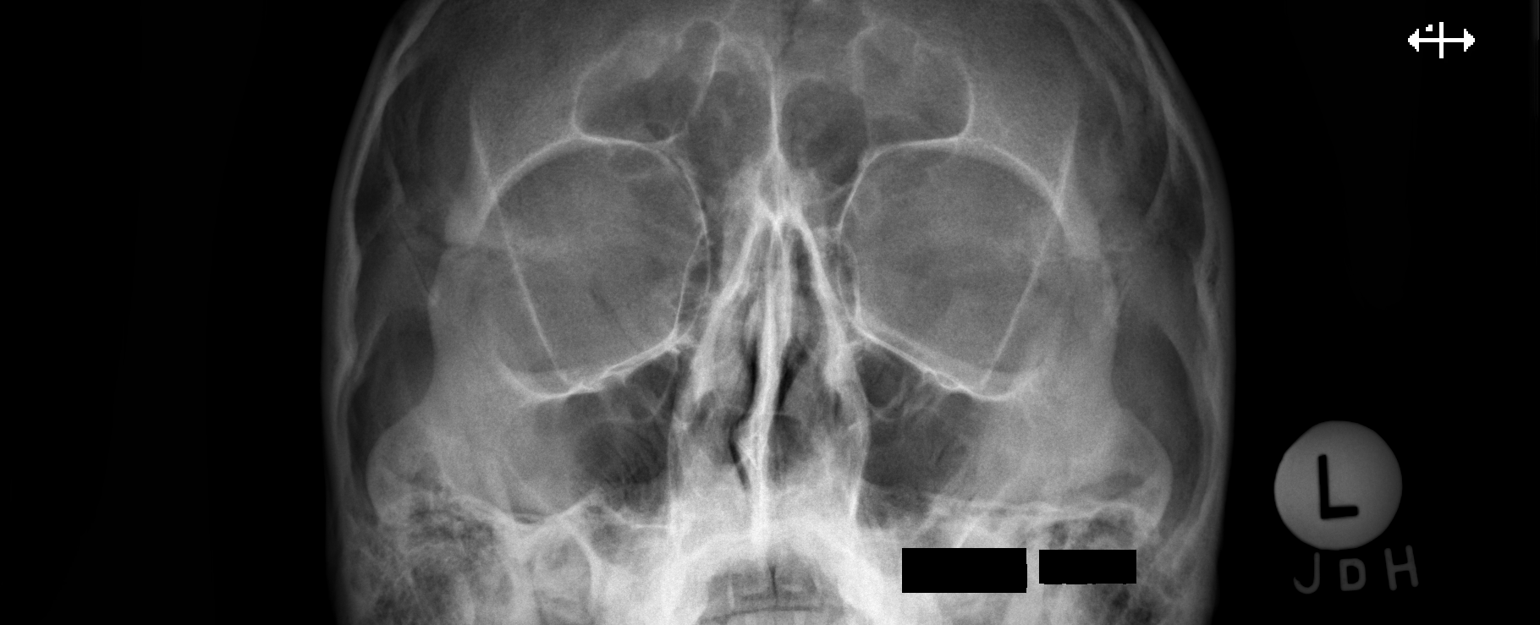

[w orbit pa (2 of 2)]
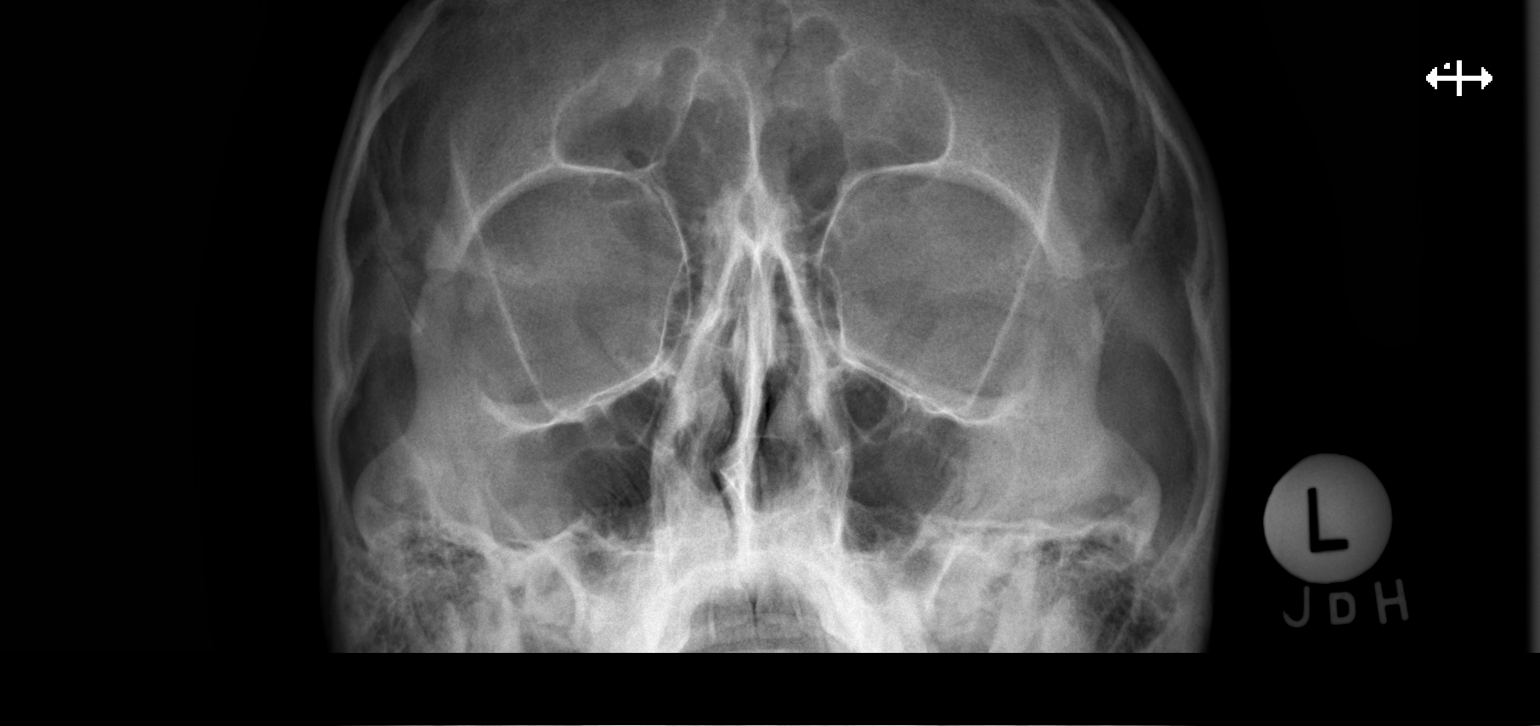

[2 of 2 positions shown; findings below may reference images not displayed]

FINDINGS: No metallic foreign body noted. Orbits are normal. Paranasal sinuses
are normal. Visualized mastoids are clear.
IMPRESSION: No evidence of metallic foreign body within the orbits.

## 2022-04-29 ENCOUNTER — Encounter: Payer: Self-pay | Admitting: Nurse Practitioner

## 2022-04-29 ENCOUNTER — Ambulatory Visit (INDEPENDENT_AMBULATORY_CARE_PROVIDER_SITE_OTHER): Payer: Self-pay | Admitting: Nurse Practitioner

## 2022-04-29 VITALS — BP 102/67 | HR 96 | Ht 66.93 in | Wt 143.0 lb

## 2022-04-29 DIAGNOSIS — G4719 Other hypersomnia: Secondary | ICD-10-CM

## 2022-04-29 DIAGNOSIS — Z7689 Persons encountering health services in other specified circumstances: Secondary | ICD-10-CM

## 2022-04-29 DIAGNOSIS — R0683 Snoring: Secondary | ICD-10-CM

## 2022-04-29 NOTE — Progress Notes (Signed)
New Patient Office Visit  Subjective    Patient ID: [name], male    DOB: 05-12-1991  Age: 31 y.o. MRN: [id number]  CC:  Chief Complaint  Patient presents with   New Patient (Initial Visit)    HPI [name] presents to establish care -Patient states that he is relatively new to te [place] area. Lives in [place] and has been there for about a year.  -states that it has been about 2 years since he saw primary care provider.  -having trouble with sleep. Unable to sleep on his back. States that if he does fall asleep on his back, he wakes up gasping for breath. Will snore if he falls asleep in his back. Feels like nasal/oropharynx is being blocked by the force of the air upon exhalation.  -does report excess daytime sleepiness.  -has not had sleep apnea study in the past.  -has noted some intermittent tinnitus. This usually associated with laying down in the dark and when things are quiet. Also states that this always happens on the right side   Outpatient Encounter Medications as of 04/29/2022  Medication Sig   naproxen sodium (ANAPROX) 220 MG tablet Take 220 mg by mouth 2 (two) times daily with a meal. As needed for migraines.   [DISCONTINUED] triamcinolone ointment (KENALOG) 0.5 % Apply 1 application topically 2 (two) times daily.   No facility-administered encounter medications on file as of 04/29/2022.    Past Medical History:  Diagnosis Date   Abdominal pain, generalized    Congenital musculoskeletal deformity of spine    Headache(784.0)    Ingrowing nail    Other acne    Other anxiety states    Shortness of breath     History reviewed. No pertinent surgical history.  Family History  Problem Relation Age of Onset   High Cholesterol Father    High blood pressure Father    Diabetes Other     Social History   Socioeconomic History   Marital status: Single    Spouse name: Not on file   Number of children: Not on file   Years of education:  Not on file   Highest education level: Not on file  Occupational History   Not on file  Tobacco Use   Smoking status: Former    Types: Cigarettes   Smokeless tobacco: Former  Substance and Sexual Activity   Alcohol use: Yes    Comment: occasional   Drug use: Yes    Comment: MJ-regularly   Sexual activity: Yes    Partners: Female  Other Topics Concern   Not on file  Social History Narrative   Not on file   Social Determinants of Health   Financial Resource Strain: Not on file  Food Insecurity: Not on file  Transportation Needs: Not on file  Physical Activity: Not on file  Stress: Not on file  Social Connections: Not on file  Intimate Partner Violence: Not on file    Review of Systems  Constitutional:  Positive for malaise/fatigue. Negative for chills and fever.  HENT:  Negative for congestion, sinus pain and sore throat.   Eyes: Negative.   Respiratory:  Negative for cough, shortness of breath and wheezing.   Cardiovascular:  Negative for chest pain, palpitations and leg swelling.  Gastrointestinal:  Negative for constipation, diarrhea, nausea and vomiting.  Genitourinary: Negative.   Musculoskeletal:  Negative for myalgias.  Skin: Negative.   Neurological:  Negative for dizziness and headaches.  Endo/Heme/Allergies:  Does not bruise/bleed easily.  Psychiatric/Behavioral:  Negative for depression. The patient is not nervous/anxious.         Objective    Today's Vitals   04/29/22 1451  BP: 102/67  Pulse: 96  SpO2: 97%  Weight: 143 lb (64.9 kg)  Height: 5' 6.93" (1.7 m)   Body mass index is 22.44 kg/m.   Physical Exam Vitals and nursing note reviewed.  Constitutional:      Appearance: Normal appearance. He is well-developed.  HENT:     Head: Normocephalic and atraumatic.     Nose: Nose normal.     Mouth/Throat:     Mouth: Mucous membranes are moist.     Pharynx: Oropharynx is clear.  Eyes:     Conjunctiva/sclera: Conjunctivae normal.     Pupils:  Pupils are equal, round, and reactive to light.  Cardiovascular:     Rate and Rhythm: Normal rate and regular rhythm.     Pulses: Normal pulses.     Heart sounds: Normal heart sounds.  Pulmonary:     Effort: Pulmonary effort is normal.     Breath sounds: Normal breath sounds.  Abdominal:     Palpations: Abdomen is soft.  Musculoskeletal:        General: Normal range of motion.     Cervical back: Normal range of motion and neck supple.  Lymphadenopathy:     Cervical: No cervical adenopathy.  Skin:    General: Skin is warm and dry.     Capillary Refill: Capillary refill takes less than 2 seconds.  Neurological:     General: No focal deficit present.     Mental Status: He is alert and oriented to person, place, and time.  Psychiatric:        Mood and Affect: Mood normal.        Behavior: Behavior normal.        Thought Content: Thought content normal.        Judgment: Judgment normal.       Assessment & Plan:  1. Excessive daytime sleepiness Refer to sleep studies for further evaluation. - Ambulatory referral to Sleep Studies  2. Snoring Refer to sleep studies for further evaluation. - Ambulatory referral to Sleep Studies  3. Encounter to establish care Appointment today to establish new primary care provider     Problem List Items Addressed This Visit       Other   Excessive daytime sleepiness - Primary   Relevant Orders   Ambulatory referral to Sleep Studies   Snoring   Relevant Orders   Ambulatory referral to Sleep Studies   Other Visit Diagnoses     Encounter to establish care           Return in about 2 months (around 06/30/2022) for health maintenance exam, FBW a week prior to visit.   Ronnell Freshwater, NP

## 2022-05-10 DIAGNOSIS — G4719 Other hypersomnia: Secondary | ICD-10-CM | POA: Insufficient documentation

## 2022-05-10 DIAGNOSIS — R0683 Snoring: Secondary | ICD-10-CM | POA: Insufficient documentation

## 2022-05-11 ENCOUNTER — Telehealth: Payer: Self-pay | Admitting: Nurse Practitioner

## 2022-05-11 NOTE — Telephone Encounter (Signed)
Another order was placed for [facility]

## 2022-05-11 NOTE — Telephone Encounter (Signed)
The sleep center from [facility] called and stated that they saw where patient was referred to have a sleep study but there was no Order put in Epic for this & needed an Order put in and called back at [phone] once this is done. AMUCK

## 2022-06-08 ENCOUNTER — Institutional Professional Consult (permissible substitution): Payer: Self-pay | Admitting: Neurology

## 2022-06-15 ENCOUNTER — Institutional Professional Consult (permissible substitution): Payer: Self-pay | Admitting: Neurology

## 2022-06-19 ENCOUNTER — Other Ambulatory Visit: Payer: Self-pay

## 2022-06-19 DIAGNOSIS — Z1329 Encounter for screening for other suspected endocrine disorder: Secondary | ICD-10-CM

## 2022-06-19 DIAGNOSIS — Z0000 Encounter for general adult medical examination without abnormal findings: Secondary | ICD-10-CM

## 2022-06-23 ENCOUNTER — Other Ambulatory Visit: Payer: Self-pay

## 2022-06-23 DIAGNOSIS — Z0000 Encounter for general adult medical examination without abnormal findings: Secondary | ICD-10-CM

## 2022-06-23 DIAGNOSIS — Z130 Encounter for screening for diseases of the blood and blood-forming organs and certain disorders involving the immune mechanism: Secondary | ICD-10-CM

## 2022-06-24 LAB — COMPREHENSIVE METABOLIC PANEL
ALT: 15 IU/L (ref 0–44)
AST: 16 IU/L (ref 0–40)
Albumin/Globulin Ratio: 2 (ref 1.2–2.2)
Albumin: 4.7 g/dL (ref 4.3–5.2)
Alkaline Phosphatase: 79 IU/L (ref 44–121)
BUN/Creatinine Ratio: 15 (ref 9–20)
BUN: 13 mg/dL (ref 6–20)
Bilirubin Total: 0.6 mg/dL (ref 0.0–1.2)
CO2: 24 mmol/L (ref 20–29)
Calcium: 9.7 mg/dL (ref 8.7–10.2)
Chloride: 102 mmol/L (ref 96–106)
Creatinine, Ser: 0.86 mg/dL (ref 0.76–1.27)
Globulin, Total: 2.3 g/dL (ref 1.5–4.5)
Glucose: 128 mg/dL — ABNORMAL HIGH (ref 70–99)
Potassium: 4.1 mmol/L (ref 3.5–5.2)
Sodium: 141 mmol/L (ref 134–144)
Total Protein: 7 g/dL (ref 6.0–8.5)
eGFR: 120 mL/min/{1.73_m2} (ref >59)

## 2022-06-24 LAB — CBC
Hematocrit: 45.2 % (ref 37.5–51.0)
Hemoglobin: 15.7 g/dL (ref 13.0–17.7)
MCH: 31.5 pg (ref 26.6–33.0)
MCHC: 34.7 g/dL (ref 31.5–35.7)
MCV: 91 fL (ref 79–97)
Platelets: 253 10*3/uL (ref 150–450)
RBC: 4.98 x10E6/uL (ref 4.14–5.80)
RDW: 12.1 % (ref 11.6–15.4)
WBC: 4.7 10*3/uL (ref 3.4–10.8)

## 2022-06-24 LAB — HEMOGLOBIN A1C
Est. average glucose Bld gHb Est-mCnc: 103 mg/dL
Hgb A1c MFr Bld: 5.2 % (ref 4.8–5.6)

## 2022-06-24 LAB — LIPID PANEL
Chol/HDL Ratio: 3.2 ratio (ref 0.0–5.0)
Cholesterol, Total: 146 mg/dL (ref 100–199)
HDL: 46 mg/dL (ref >39)
LDL Chol Calc (NIH): 85 mg/dL (ref 0–99)
Triglycerides: 74 mg/dL (ref 0–149)
VLDL Cholesterol Cal: 15 mg/dL (ref 5–40)

## 2022-06-24 LAB — TSH: TSH: 1.01 u[IU]/mL (ref 0.450–4.500)

## 2022-06-29 NOTE — Progress Notes (Signed)
Complete physical exam   Patient: [name]   DOB: 12/05/1990   31 y.o. Male  MRN: [id number] Visit Date: 06/30/2022    Chief Complaint  Patient presents with   Annual Exam   Subjective    [name] is a 31 y.o. male who presents today for a complete physical exam.  He reports consuming a  generally healthy,  diet. Sometimes, does "gorge" on sweets.  Walks routinely, goes canoeing, plays ball with friends, and has a physically demanding job.   He generally feels well. He does have additional problems to discuss today.   HPI  Annual physical  -did get referral to sleep studies  -routine, fasting labs done prior to this visit.  --glucose 128 with normal HgbA1c at 5.2 -feels very fatigued.  -gets migraines. Able to take aleve and this does help the migraines  -did have some suprapubic pain the other day. Mostly in his bladder. Did have some cloudy urine. This has subsided since then.   Past Medical History:  Diagnosis Date   Abdominal pain, generalized    Congenital musculoskeletal deformity of spine    Headache(784.0)    Ingrowing nail    Other acne    Other anxiety states    Shortness of breath    History reviewed. No pertinent surgical history. Social History   Socioeconomic History   Marital status: Single    Spouse name: Not on file   Number of children: Not on file   Years of education: Not on file   Highest education level: Not on file  Occupational History   Not on file  Tobacco Use   Smoking status: Former    Types: Cigarettes   Smokeless tobacco: Former   Tobacco comments:    Pt vapes daily   Vaping Use   Vaping Use: Every day  Substance and Sexual Activity   Alcohol use: Yes    Alcohol/week: 5.0 standard drinks of alcohol    Types: 5 Cans of beer per week    Comment: occasional   Drug use: Yes    Comment: MJ-regularly   Sexual activity: Yes    Partners: Female  Other Topics Concern   Not on file  Social History Narrative    Not on file   Social Determinants of Health   Financial Resource Strain: Not on file  Food Insecurity: Not on file  Transportation Needs: Not on file  Physical Activity: Not on file  Stress: Not on file  Social Connections: Not on file  Intimate Partner Violence: Not on file   Family Status  Relation Name Status   Mother  Alive       Healthy   Father  Alive       Healthy   Sister  Alive   Other  (Not Specified)   Neg Hx  (Not Specified)   Family History  Problem Relation Age of Onset   High Cholesterol Father    High blood pressure Father    Diabetes Other    Sleep apnea Neg Hx    Allergies  Allergen Reactions   Ibuprofen     Stomach upset, headaches - does ok with aleve    Patient Care Team: Ronnell Freshwater, NP as PCP - General (Family Medicine)   Medications: Outpatient Medications Prior to Visit  Medication Sig   naproxen sodium (ANAPROX) 220 MG tablet Take 220 mg by mouth 2 (two) times daily with a meal. As needed for migraines.   No  facility-administered medications prior to visit.    Review of Systems  Constitutional:  Positive for fatigue. Negative for activity change, chills and fever.  HENT:  Negative for congestion, postnasal drip, rhinorrhea, sinus pressure, sinus pain, sneezing and sore throat.   Eyes: Negative.   Respiratory:  Negative for cough, shortness of breath and wheezing.   Cardiovascular:  Negative for chest pain and palpitations.  Gastrointestinal:  Negative for constipation, diarrhea, nausea and vomiting.  Endocrine: Negative for cold intolerance, heat intolerance, polydipsia and polyuria.  Genitourinary:  Positive for dysuria and hematuria. Negative for frequency and urgency.  Musculoskeletal:  Positive for back pain. Negative for myalgias.  Skin:  Negative for rash.  Allergic/Immunologic: Negative for environmental allergies.  Neurological:  Negative for dizziness, weakness and headaches.  Psychiatric/Behavioral:  The patient is  not nervous/anxious.     Last CBC Lab Results  Component Value Date   WBC 4.7 06/23/2022   HGB 15.7 06/23/2022   HCT 45.2 06/23/2022   MCV 91 06/23/2022   MCH 31.5 06/23/2022   RDW 12.1 06/23/2022   PLT 253 67/09/4579   Last metabolic panel Lab Results  Component Value Date   GLUCOSE 128 (H) 06/23/2022   NA 141 06/23/2022   K 4.1 06/23/2022   CL 102 06/23/2022   CO2 24 06/23/2022   BUN 13 06/23/2022   CREATININE 0.86 06/23/2022   EGFR 120 06/23/2022   CALCIUM 9.7 06/23/2022   PROT 7.0 06/23/2022   ALBUMIN 4.7 06/23/2022   LABGLOB 2.3 06/23/2022   AGRATIO 2.0 06/23/2022   BILITOT 0.6 06/23/2022   ALKPHOS 79 06/23/2022   AST 16 06/23/2022   ALT 15 06/23/2022   Last lipids Lab Results  Component Value Date   CHOL 146 06/23/2022   HDL 46 06/23/2022   LDLCALC 85 06/23/2022   TRIG 74 06/23/2022   CHOLHDL 3.2 06/23/2022   Last hemoglobin A1c Lab Results  Component Value Date   HGBA1C 5.2 06/23/2022   Last thyroid functions Lab Results  Component Value Date   TSH 1.010 06/23/2022        Objective     Today's Vitals   06/30/22 1516  BP: 100/66  Pulse: 90  SpO2: 100%  Weight: 134 lb (60.8 kg)  Height: 5' 6.93" (1.7 m)   Body mass index is 21.03 kg/m.   BP Readings from Last 3 Encounters:  07/09/22 112/67  06/30/22 100/66  04/29/22 102/67    Wt Readings from Last 3 Encounters:  07/09/22 134 lb 12.8 oz (61.1 kg)  06/30/22 134 lb (60.8 kg)  04/29/22 143 lb (64.9 kg)     Physical Exam Vitals and nursing note reviewed.  Constitutional:      Appearance: Normal appearance. He is well-developed.  HENT:     Head: Normocephalic and atraumatic.     Right Ear: Tympanic membrane and external ear normal.     Left Ear: Tympanic membrane, ear canal and external ear normal.     Nose: Nose normal.     Mouth/Throat:     Mouth: Mucous membranes are moist.     Pharynx: Oropharynx is clear.  Eyes:     Extraocular Movements: Extraocular movements intact.      Conjunctiva/sclera: Conjunctivae normal.     Pupils: Pupils are equal, round, and reactive to light.  Cardiovascular:     Rate and Rhythm: Normal rate and regular rhythm.     Pulses: Normal pulses.     Heart sounds: Normal heart sounds.  Pulmonary:  Effort: Pulmonary effort is normal.     Breath sounds: Normal breath sounds.  Abdominal:     General: Bowel sounds are normal. There is no distension.     Palpations: Abdomen is soft. There is no mass.     Tenderness: There is no abdominal tenderness. There is no right CVA tenderness, left CVA tenderness, guarding or rebound.     Hernia: No hernia is present.  Musculoskeletal:        General: Normal range of motion.     Cervical back: Normal range of motion and neck supple.  Lymphadenopathy:     Cervical: No cervical adenopathy.  Skin:    General: Skin is warm and dry.     Capillary Refill: Capillary refill takes less than 2 seconds.  Neurological:     General: No focal deficit present.     Mental Status: He is alert and oriented to person, place, and time.  Psychiatric:        Mood and Affect: Mood normal.        Behavior: Behavior normal.        Thought Content: Thought content normal.        Judgment: Judgment normal.     Last depression screening scores   Row Labels 06/30/2022    3:17 PM 04/29/2022    2:54 PM  PHQ 2/9 Scores   Section Header. No data exists in this row.    PHQ - 2 Score   0 1  PHQ- 9 Score   4 6   Last fall risk screening   Row Labels 04/29/2022    2:55 PM  Fall Risk    Section Header. No data exists in this row.   Falls in the past year?   0  Number falls in past yr:   0  Injury with Fall?   0  Follow up   Falls evaluation completed    Results for orders placed or performed in visit on 06/30/22  Urine Culture   Specimen: Urine   Urine  Result Value Ref Range   Urine Culture, Routine Final report    Organism ID, Bacteria No growth   POCT URINALYSIS DIP (CLINITEK)  Result Value Ref Range    Color, UA yellow yellow   Clarity, UA clear clear   Glucose, UA negative negative mg/dL   Bilirubin, UA negative negative   Ketones, POC UA negative negative mg/dL   Spec Grav, UA 1.025 1.010 - 1.025   Blood, UA trace-intact (A) negative   pH, UA 6.5 5.0 - 8.0   POC PROTEIN,UA negative negative, trace   Urobilinogen, UA 0.2 0.2 or 1.0 E.U./dL   Nitrite, UA Negative Negative   Leukocytes, UA Negative Negative    Assessment & Plan    1. Encounter for general adult medical examination with abnormal findings Annual physical today   2. Dysuria Trace blood in urine sample. Patient with history of kidney stone. Will monitor.  - POCT URINALYSIS DIP (CLINITEK) - Urine Culture; Future - Urine Culture  3. Excessive daytime sleepiness Patient will be following up with sleep studies for further evaluation of EDS      Health Maintenance  Topic Date Due   COVID-19 Vaccine (1) Never done   Hepatitis C Screening  Never done   TETANUS/TDAP  Never done   INFLUENZA VACCINE  Never done   HIV Screening  07/01/2023 (Originally 10/18/2007)   HPV VACCINES  Aged Out    Discussed health benefits of physical activity,  and encouraged him to engage in regular exercise appropriate for his age and condition.  Problem List Items Addressed This Visit       Other   Dysuria   Relevant Orders   POCT URINALYSIS DIP (CLINITEK) (Completed)   Urine Culture (Completed)   Excessive daytime sleepiness   Other Visit Diagnoses     Encounter for general adult medical examination with abnormal findings    -  Primary        Return in about 1 year (around 07/01/2023) for health maintenance exam, FBW a week prior to visit.        Ronnell Freshwater, NP  [facility] [phone] (phone) [phone] (fax)  [place]

## 2022-06-30 ENCOUNTER — Encounter: Payer: Self-pay | Admitting: Nurse Practitioner

## 2022-06-30 ENCOUNTER — Ambulatory Visit (INDEPENDENT_AMBULATORY_CARE_PROVIDER_SITE_OTHER): Payer: Self-pay | Admitting: Nurse Practitioner

## 2022-06-30 VITALS — BP 100/66 | HR 90 | Ht 66.93 in | Wt 134.0 lb

## 2022-06-30 DIAGNOSIS — R52 Pain, unspecified: Secondary | ICD-10-CM

## 2022-06-30 DIAGNOSIS — G4719 Other hypersomnia: Secondary | ICD-10-CM

## 2022-06-30 DIAGNOSIS — Z0001 Encounter for general adult medical examination with abnormal findings: Secondary | ICD-10-CM

## 2022-06-30 DIAGNOSIS — R300 Dysuria: Secondary | ICD-10-CM

## 2022-06-30 LAB — POCT URINALYSIS DIP (CLINITEK)
Bilirubin, UA: NEGATIVE
Glucose, UA: NEGATIVE mg/dL
Ketones, POC UA: NEGATIVE mg/dL
Leukocytes, UA: NEGATIVE
Nitrite, UA: NEGATIVE
POC PROTEIN,UA: NEGATIVE
Spec Grav, UA: 1.025 (ref 1.010–1.025)
Urobilinogen, UA: 0.2 E.U./dL
pH, UA: 6.5 (ref 5.0–8.0)

## 2022-06-30 NOTE — Progress Notes (Signed)
Labs discussed with patient during visit .

## 2022-06-30 NOTE — Progress Notes (Signed)
Please let the patient know that his urine showed a small amount of blood. He may have recently passed a kidney stone, which would explain his symptoms and how they came and then mostly resolved. We are going to send the urine for culture and sensitivity. If there is evidence of infection, I will send antibiotics and let him know the results.  Thanks so much.   -HB

## 2022-07-02 LAB — URINE CULTURE: Organism ID, Bacteria: NO GROWTH

## 2022-07-02 NOTE — Progress Notes (Signed)
Please let the patient know that his urine culture was negative for any bacterial growth. (No infection). Based on his description of symptoms, he likely passed a kidney stone.  Thanks  -HB

## 2022-07-09 ENCOUNTER — Ambulatory Visit: Payer: Self-pay | Admitting: Neurology

## 2022-07-09 ENCOUNTER — Encounter: Payer: Self-pay | Admitting: Neurology

## 2022-07-09 VITALS — BP 112/67 | HR 80 | Ht 66.0 in | Wt 134.8 lb

## 2022-07-09 DIAGNOSIS — R0689 Other abnormalities of breathing: Secondary | ICD-10-CM

## 2022-07-09 DIAGNOSIS — G4719 Other hypersomnia: Secondary | ICD-10-CM

## 2022-07-09 DIAGNOSIS — R519 Headache, unspecified: Secondary | ICD-10-CM

## 2022-07-09 DIAGNOSIS — R0683 Snoring: Secondary | ICD-10-CM

## 2022-07-09 NOTE — Patient Instructions (Signed)

## 2022-07-09 NOTE — Progress Notes (Signed)
Subjective:    Patient ID: [name] is a 31 y.o. male.  HPI    Star Age, MD, PhD [facility] [address]O. [place], [place] [address]  Dear Nira Conn,  I saw your patient, [name], upon your kind request in my sleep clinic today for initial consultation of his sleep disorder, in particular, concern for underlying obstructive sleep apnea.  The patient is unaccompanied today.  As you know, [name] is a 31 year old right-handed gentleman with an underlying benign medical history, who reports snoring and excessive daytime somnolence as well as difficulty breathing while sleeping supine.  I reviewed your office note from 04/29/2022.  His Epworth sleepiness score is 7 out of 24, fatigue severity score is 37 out of 63.  He has occasionally woken up with a dull, achy headache.  He has a history of migraines.  His morning headaches are not typically migraine-like.  He has no night to night nocturia, no obvious family history of sleep apnea but both parents snore.  He is single and lives alone.  He works in [employer].  Bedtime is generally around 9 or 10 PM and rise time between 5:30 AM and 6 AM.  He has no children, he has 1 dog and 2 cats in the household.  He has no TV in the bedroom.  He quit smoking 1 to 2 years ago.  He does not drink any daily caffeine and alcohol occasionally on the weekends only.  Sometimes he has felt an irregularity in his heartbeat at night, he has not seen a cardiologist before, he has not had any palpitations during the day or at night.  He does not typically sleep his back, for the past 15 years or so he has slept on his stomach.  Sometimes he has anxiety at night.  He has not been on any anxiety medication lately, he has not talked to about anxiety management yet.  His Past Medical History Is Significant For: Past Medical History:  Diagnosis Date   Abdominal pain, generalized    Congenital musculoskeletal  deformity of spine    Headache(784.0)    Ingrowing nail    Other acne    Other anxiety states    Shortness of breath     His Past Surgical History Is Significant For: History reviewed. No pertinent surgical history.  His Family History Is Significant For: Family History  Problem Relation Age of Onset   High Cholesterol Father    High blood pressure Father    Diabetes Other    Sleep apnea Neg Hx     His Social History Is Significant For: Social History   Socioeconomic History   Marital status: Single    Spouse name: Not on file   Number of children: Not on file   Years of education: Not on file   Highest education level: Not on file  Occupational History   Not on file  Tobacco Use   Smoking status: Former    Types: Cigarettes   Smokeless tobacco: Former   Tobacco comments:    Pt vapes daily   Vaping Use   Vaping Use: Every day  Substance and Sexual Activity   Alcohol use: Yes    Alcohol/week: 5.0 standard drinks of alcohol    Types: 5 Cans of beer per week    Comment: occasional   Drug use: Yes    Comment: MJ-regularly   Sexual activity: Yes    Partners: Female  Other  Topics Concern   Not on file  Social History Narrative   Not on file   Social Determinants of Health   Financial Resource Strain: Not on file  Food Insecurity: Not on file  Transportation Needs: Not on file  Physical Activity: Not on file  Stress: Not on file  Social Connections: Not on file    His Allergies Are:  Allergies  Allergen Reactions   Ibuprofen     Stomach upset, headaches - does ok with aleve  :   His Current Medications Are:  Outpatient Encounter Medications as of 07/09/2022  Medication Sig   naproxen sodium (ANAPROX) 220 MG tablet Take 220 mg by mouth 2 (two) times daily with a meal. As needed for migraines.   No facility-administered encounter medications on file as of 07/09/2022.  :   Review of Systems:  Out of a complete 14 point review of systems, all are  reviewed and negative with the exception of these symptoms as listed below:  Review of Systems  Neurological:        Pt here for sleep consult   Pt snores,headaches,fatigue. Pt denies hypertension,sleep study,and CPAP machine     ESS:7 FSS:31    Objective:  Neurological Exam  Physical Exam Physical Examination:   Vitals:   07/09/22 1221  BP: 112/67  Pulse: 80    General Examination: The patient is a very pleasant 31 y.o. male in no acute distress. He appears well-developed and well-nourished and well groomed.   HEENT: Normocephalic, atraumatic, pupils are equal, round and reactive to light, extraocular tracking is good without limitation to gaze excursion or nystagmus noted. Hearing is grossly intact. Face is symmetric with normal facial animation. Speech is clear with no dysarthria noted. There is no hypophonia. There is no lip, neck/head, jaw or voice tremor. Neck is supple with full range of passive and active motion. There are no carotid bruits on auscultation. Oropharynx exam reveals: mild mouth dryness, adequate dental hygiene and mild airway crowding, due to smaller airway entry, smaller tonsils, Mallampati class II.  Slightly longer uvula.  Neck circumference of 14-5/8 inches.  Minimal overbite.  Tongue protrudes centrally and palate elevates symmetrically.  Chest: Clear to auscultation without wheezing, rhonchi or crackles noted.  Heart: S1+S2+0, regular and normal without murmurs, rubs or gallops noted.   Abdomen: Soft, non-tender and non-distended.  Extremities: There is no pitting edema in the distal lower extremities bilaterally.   Skin: Warm and dry without trophic changes noted.   Musculoskeletal: exam reveals no obvious joint deformities, tenderness or joint swelling or erythema.   Neurologically:  Mental status: The patient is awake, alert and oriented in all 4 spheres. His immediate and remote memory, attention, language skills and fund of knowledge are  appropriate. There is no evidence of aphasia, agnosia, apraxia or anomia. Speech is clear with normal prosody and enunciation. Thought process is linear. Mood is normal and affect is normal.  Cranial nerves II - XII are as described above under HEENT exam.  Motor exam: Normal bulk, strength and tone is noted. There is no obvious tremor. Fine motor skills and coordination: grossly intact.  Cerebellar testing: No dysmetria or intention tremor. There is no truncal or gait ataxia.  Sensory exam: intact to light touch in the upper and lower extremities.  Gait, station and balance: He stands easily. No veering to one side is noted. No leaning to one side is noted. Posture is age-appropriate and stance is narrow based. Gait shows normal stride length  and normal pace. No problems turning are noted.   Assessment and Plan:  In summary, [name] is a very pleasant 31 y.o.-year old male with an underlying benign medical history, whose history and physical exam are concerning for sleep disordered breathing, such as obstructive sleep apnea.    I had a long chat with the patient about my findings and the diagnosis of sleep apnea, particularly OSA, its prognosis and treatment options. We talked about medical/conservative treatments, surgical interventions and non-pharmacological approaches for symptom control. I explained, in particular, the risks and ramifications of untreated moderate to severe OSA, especially with respect to developing cardiovascular disease down the road, including congestive heart failure (CHF), difficult to treat hypertension, cardiac arrhythmias (particularly A-fib), neurovascular complications including TIA, stroke and dementia. Even type 2 diabetes has, in part, been linked to untreated OSA. Symptoms of untreated OSA may include (but may not be limited to) daytime sleepiness, nocturia (i.e. frequent nighttime urination), memory problems, mood irritability and suboptimally controlled or  worsening mood disorder such as depression and/or anxiety, lack of energy, lack of motivation, physical discomfort, as well as recurrent headaches, especially morning or nocturnal headaches. We talked about the importance of maintaining a healthy lifestyle and striving for healthy weight. In addition, we talked about the importance of striving for and maintaining good sleep hygiene. I recommended the following at this time: sleep study.  I outlined the differences between a laboratory attended sleep study which is considered more comprehensive and accurate over the option of a home sleep test (HST); the latter may lead to underestimation of sleep disordered breathing in some instances and does not help with diagnosing upper airway resistance syndrome and is not accurate enough to diagnose primary central sleep apnea typically.  He is advised to talk to you about anxiety management, the possibility also of seeing a cardiologist for concern for irregularity in his heartbeat at night.  He is furthermore advised to talk to follow-up laboratory testing.  He had labs through your office on 06/23/2022 and I reviewed the results.  I would add a vitamin B12 level and vitamin D level at the next checkup.  I explained the different sleep test procedures to the patient in detail and also outlined possible surgical and non-surgical treatment options of OSA, including the use of a pressure airway pressure (PAP) device (ie CPAP, AutoPAP/APAP or BiPAP in certain circumstances), a custom-made dental device (aka oral appliance, which would require a referral to a specialist dentist or orthodontist typically, and is generally speaking not considered a good choice for patients with full dentures or edentulous state), upper airway surgical options, such as traditional UPPP (which is not considered a first-line treatment) or the Inspire device (hypoglossal nerve stimulator, which would involve a referral for consultation with an ENT  surgeon, after careful selection, following inclusion criteria). I explained the PAP treatment option to the patient in detail, as this is generally considered first-line treatment.  The patient indicated that he would be willing to try PAP therapy, if the need arises. I explained the importance of being compliant with PAP treatment, not only for insurance purposes but primarily to improve patient's symptoms symptoms, and for the patient's long term health benefit, including to reduce His cardiovascular risks longer-term.    We will pick up our discussion about the next steps and treatment options after testing.  We will keep him posted as to the test results by phone call and/or MyChart messaging where possible.  We will plan to  follow-up in sleep clinic accordingly as well.  I answered all his questions today and the patient was in agreement.   I encouraged him to call with any interim questions, concerns, problems or updates or email [place] through [place].  Generally speaking, sleep test authorizations may take up to 2 weeks, sometimes less, sometimes longer, the patient is encouraged to get in touch with [place] if they do not hear back from the sleep lab staff directly within the next 2 weeks.  Thank you very much for allowing me to participate in the care of this nice patient. If I can be of any further assistance to you please do not hesitate to call me at [phone].  Sincerely,   Star Age, MD, PhD

## 2022-07-16 ENCOUNTER — Telehealth: Payer: Self-pay | Admitting: Neurology

## 2022-07-16 NOTE — Telephone Encounter (Signed)
NPSG-Conf with pt that he is self pay and discussed costs for each-pt chose inlab.  Patient is scheduled at [facility] for 08/20/22 at 8 pm.  Mailed packet and sent mychart message to the patient.

## 2022-08-20 ENCOUNTER — Ambulatory Visit (INDEPENDENT_AMBULATORY_CARE_PROVIDER_SITE_OTHER): Payer: Self-pay | Admitting: Neurology

## 2022-08-20 DIAGNOSIS — R519 Headache, unspecified: Secondary | ICD-10-CM

## 2022-08-20 DIAGNOSIS — G4720 Circadian rhythm sleep disorder, unspecified type: Secondary | ICD-10-CM

## 2022-08-20 DIAGNOSIS — G4719 Other hypersomnia: Secondary | ICD-10-CM

## 2022-08-20 DIAGNOSIS — R0683 Snoring: Secondary | ICD-10-CM

## 2022-08-20 DIAGNOSIS — R0689 Other abnormalities of breathing: Secondary | ICD-10-CM

## 2022-08-25 NOTE — Procedures (Signed)
Physician Interpretation:     [facility] POLYSOMNOGRAPHY  INTERPRETATION REPORT   STUDY DATE:  08/20/2022     PATIENT NAME:  [name]         DATE OF BIRTH:  21-Aug-1991  PATIENT ID:  [id number]    TYPE OF STUDY:  PSG  READING PHYSICIAN: Star Age, MD REFERRED BY: Leretha Pol, NP SCORING TECHNICIAN: Gaylyn Cheers, RPSGT   HISTORY:  31 year old right-handed gentleman with an underlying benign medical history, who reports snoring and excessive daytime somnolence as well as difficulty breathing while sleeping supine. His Epworth sleepiness score is 7 out of 24, fatigue severity score is 37 out of 63. Height: 66 in Weight: 134 lb (BMI 21) Neck Size: 15 in.   MEDICATIONS: Anaprox  TECHNICAL DESCRIPTION: A registered sleep technologist was in attendance for the duration of the recording.  Data collection, scoring, video monitoring, and reporting were performed in compliance with the AASM Manual for the Scoring of Sleep and Associated Events; (Hypopnea is scored based on the criteria listed in Section VIII D. 1b in the AASM Manual V2.6 using a 4% oxygen desaturation rule or Hypopnea is scored based on the criteria listed in Section VIII D. 1a in the AASM Manual V2.6 using 3% oxygen desaturation and /or arousal rule).   SLEEP CONTINUITY AND SLEEP ARCHITECTURE:  Lights-out was at 21:57: and lights-on at  05:09:, with a total recording time of 7 hours, 12 min. Total sleep time ( TST) was 322.5 minutes with a decreased sleep efficiency at 74.7%.    BODY POSITION:  TST was divided  between the following sleep positions: 46.8% supine;  53.2% lateral;  0% prone. Duration of total sleep and percent of total sleep in their respective position is as follows: supine 151 minutes (47%), non-supine 172 minutes (53%); right 166 minutes (52%), left 05 minutes (2%), and prone 00 minutes (0%).  Total supine REM sleep time was 19 minutes (30% of total REM  sleep). Sleep latency was increased at 42.0 minutes.  REM sleep latency was mildly increased at 113.5 minutes. Of the total sleep time, the percentage of stage N1 sleep was 15.0%, which is increased, stage N2 sleep was 65%, which is increased, stage N3 sleep was absent, and REM sleep was 20.0%, which is normal. Wake after sleep onset (WASO) time accounted for 67 minutes with mild to moderate sleep fragmentation noted.   RESPIRATORY MONITORING:  Based on CMS criteria (using a 4% oxygen desaturation rule for scoring hypopneas), there were 12 apneas (4 obstructive; 8 central; 0 mixed), and 4 hypopneas.  Apnea index was 2.2. Hypopnea index was 0.7. The apnea-hypopnea index was 3.0 overall (4.4 supine, 0 non-supine; 3.7 REM, 12.3 supine REM). There were 0 respiratory effort-related arousals (RERAs).  The RERA index was 0 events/h. Total respiratory disturbance index (RDI) was 3.0 events/h. RDI results showed: supine RDI  4.4 /h; non-supine RDI 1.7 /h; REM RDI 3.7 /h, supine REM RDI 12.3 /h.   Based on AASM criteria (using a 3% oxygen desaturation and /or arousal rule for scoring hypopneas), there were 12 apneas (4 obstructive; 8 central; 0 mixed), and 6 hypopneas. Apnea index was 2.2. Hypopnea index was 1.1. The apnea-hypopnea index was 3.3/hour overall (4.8 supine, 0 non-supine; 3.7 REM, 12.3 supine REM). There were 0 respiratory effort-related arousals (RERAs).  The RERA index was 0 events/h. Total respiratory disturbance index (RDI) was 3.3 events/h. RDI results showed: supine RDI  4.8 /h; non-supine RDI 2.1 /h;  REM RDI 3.7 /h, supine REM RDI 12.3 /h.   OXIMETRY: Oxyhemoglobin Saturation Nadir during sleep was at  87%) from a mean of 96%.  Of the Total sleep time (TST)   hypoxemia (=<88%) was present for  0.1 minutes, or 0.0% of total sleep time.   LIMB MOVEMENTS: There were 0 periodic limb movements of sleep (0.0/hr), of which 0 (0.0/hr) were associated with an arousal.  AROUSAL: There were 32 arousals  in total, for an arousal index of 6 arousals/hour.  Of these, 3 were identified as respiratory-related arousals (1 /h), 0 were PLM-related arousals (0 /h), and 45 were non-specific arousals (8 /h).  EEG: Review of the EEG showed no abnormal electrical discharges and symmetrical bihemispheric findings.    EKG: The EKG revealed normal sinus rhythm (NSR). The average heart rate during sleep was 58 bpm, maximum of 84 bpm.  AUDIO/VIDEO REVIEW: The audio and video review did not show any abnormal or unusual behaviors, movements, phonations or vocalizations. The patient took no restroom breaks. Snoring was not audible.  POST-STUDY QUESTIONNAIRE: Post study, the patient indicated, that sleep was worse than usual.   IMPRESSION:   1. Dysfunctions associated with sleep stages or arousal from sleep  RECOMMENDATIONS:   1. This study does not demonstrate any significant obstructive or central sleep disordered breathing with an AHI of less than 5/hour - his total AHI was 3.3/h, O2 nadir 87% (briefly, with time below or at 88% saturation of less than 1 min).  No significant snoring was noted.  Treatment with a positive airway pressure device, such as CPAP or autoPAP is not indicated.   2. This study shows mild to moderate sleep fragmentation and abnormal sleep stage percentages; these are nonspecific findings and per se do not signify an intrinsic sleep disorder or a cause for the patient's sleep-related symptoms. Causes include (but are not limited to) the first night effect of the sleep study, circadian rhythm disturbances, medication effect or an underlying mood disorder or medical problem.  3. The patient should be cautioned not to drive, work at heights, or operate dangerous or heavy equipment when tired or sleepy. Review and reiteration of good sleep hygiene measures should be pursued with any patient. 4. The patient will be advised to follow up with the referring provider, who will be notified of the test  results.   I certify that I have reviewed the entire raw data recording prior to the issuance of this report in accordance with the Standards of Accreditation of the American Academy of Sleep Medicine (AASM). Star Age, MD, PhD [facility]) [place], ABPN (Neurology and Sleep)           Technical Report:   General Information  Name: [name], [name] BMI: 21.63 Physician: Star Age, MD  ID: [id number] Height: 66.0 in Technician: Gaylyn Cheers, RPSGT  Sex: Male Weight: 134.0 lb Record: [id number]  Age: 59 [06-21-1991] Date: 08/20/2022    Medical & Medication History    [name] is a 31 year old right-handed gentleman with an underlying benign medical history, who reports snoring and excessive daytime somnolence as well as difficulty breathing while sleeping supine. His Epworth sleepiness score is 7 out of 24, fatigue severity score is 37 out of 63. He has occasionally woken up with a dull, achy headache. He has a history of migraines. His morning headaches are not typically migraine-like. He has no night to night nocturia, no obvious family history of sleep apnea but both parents snore. Anaprox  Sleep Disorder      Comments   Patient arrived for a diagnostic polysomnogram. Procedure explained and all questions answered. Standard paste setup without complications. Patient slept supine, let, and right side. No audible snoring was heard. Few respiratory events observed. No obvious cardiac arrhythmias observed. No significant PLMS observed. No restroom visits.    Lights out: 09:57:42 PM Lights on: 05:09:32 AM   Time Total Supine Side Prone Upright  Recording (TRT) 7h 12.2m4h 3.587mh 8.37m52m 0.31m 72m0.31m  88mep (TST) 5h 23.31m 2h33m.31m 2h 87m31m 0h 064m 0h 0.11m  Late36m N1 N2 N3 REM Onset Per. Slp. Eff.  Actual 0h 0.31m 0h 41.074mh 0.31m 59m53.37m 37m42.31m 133m2.31m 7412m%   Stg83mr Wake N1 N2 N3 REM  Total 109.0 48.5 209.5 0.0 64.5  Supine  92.5 28.5 103.0 0.0 19.5  Side 16.5 20.0 106.5 0.0 45.0  Prone 0.0 0.0 0.0 0.0 0.0  Upright 0.0 0.0 0.0 0.0 0.0   Stg % Wake N1 N2 N3 REM  Total 25.2 15.0 64.9 0.0 20.0  Supine 21.4 8.8 31.9 0.0 6.0  Side 3.8 6.2 33.0 0.0 13.9  Prone 0.0 0.0 0.0 0.0 0.0  Upright 0.0 0.0 0.0 0.0 0.0     Apnea Summary Sub Supine Side Prone Upright  Total 12 Total '12 7 5 '$ 0 0    REM 3 3 0 0 0    NREM '9 4 5 '$ 0 0  Obs 4 REM 0 0 0 0 0    NREM 4 4 0 0 0  Mix 0 REM 0 0 0 0 0    NREM 0 0 0 0 0  Cen 8 REM 3 3 0 0 0    NREM 5 0 5 0 0   Rera Summary Sub Supine Side Prone Upright  Total 0 Total 0 0 0 0 0    REM 0 0 0 0 0    NREM 0 0 0 0 0   Hypopnea Summary Sub Supine Side Prone Upright  Total 6 Total '6 5 1 '$ 0 0    REM 1 1 0 0 0    NREM '5 4 1 '$ 0 0   4% Hypopnea Summary Sub Supine Side Prone Upright  Total (4%) 4 Total 4 4 0 0 0    REM 1 1 0 0 0    NREM 3 3 0 0 0     AHI Total Obs Mix Cen  3.34 Apnea 2.23 0.74 0.00 1.49   Hypopnea 1.11 -- -- --  2.97 Hypopnea (4%) 0.74 -- -- --    Total Supine Side Prone Upright  Position AHI 3.34 4.77 2.09 0.00 0.00  REM AHI 3.72   NREM AHI 3.26   Position RDI 3.34 4.77 2.09 0.00 0.00  REM RDI 3.72   NREM RDI 3.26    4% Hypopnea Total Supine Side Prone Upright  Position AHI (4%) 2.97 4.37 1.74 0.00 0.00  REM AHI (4%) 3.72   NREM AHI (4%) 2.79   Position RDI (4%) 2.97 4.37 1.74 0.00 0.00  REM RDI (4%) 3.72   NREM RDI (4%) 2.79    Desaturation Information Threshold: 2% <100% <90% <80% <70% <60% <50% <40%  Supine 92.0 1.0 0.0 0.0 0.0 0.0 0.0  Side 59.0 1.0 0.0 0.0 0.0 0.0 0.0  Prone 0.0 0.0 0.0 0.0 0.0 0.0 0.0  Upright 0.0 0.0 0.0 0.0 0.0 0.0 0.0  Total 151.0 2.0 0.0 0.0 0.0 0.0  0.0  Index 23.3 0.3 0.0 0.0 0.0 0.0 0.0   Threshold: 3% <100% <90% <80% <70% <60% <50% <40%  Supine 36.0 1.0 0.0 0.0 0.0 0.0 0.0  Side 23.0 1.0 0.0 0.0 0.0 0.0 0.0  Prone 0.0 0.0 0.0 0.0 0.0 0.0 0.0  Upright 0.0 0.0 0.0 0.0 0.0 0.0 0.0  Total 59.0 2.0 0.0 0.0 0.0 0.0 0.0   Index 9.1 0.3 0.0 0.0 0.0 0.0 0.0   Threshold: 4% <100% <90% <80% <70% <60% <50% <40%  Supine 19.0 1.0 0.0 0.0 0.0 0.0 0.0  Side 14.0 1.0 0.0 0.0 0.0 0.0 0.0  Prone 0.0 0.0 0.0 0.0 0.0 0.0 0.0  Upright 0.0 0.0 0.0 0.0 0.0 0.0 0.0  Total 33.0 2.0 0.0 0.0 0.0 0.0 0.0  Index 5.1 0.3 0.0 0.0 0.0 0.0 0.0   Threshold: 3% <100% <90% <80% <70% <60% <50% <40%  Supine 36 1 0 0 0 0 0  Side 23 1 0 0 0 0 0  Prone 0 0 0 0 0 0 0  Upright 0 0 0 0 0 0 0  Total 59 2 0 0 0 0 0   Awakening/Arousal Information # of Awakenings 22  Wake after sleep onset 67.4m Wake after persistent sleep 38.578m Arousal Assoc. Arousals Index  Apneas 2 0.4  Hypopneas 1 0.2  Leg Movements 0 0.0  Snore 0 0.0  PTT Arousals 0 0.0  Spontaneous 45 8.4  Total 48 8.9  Leg Movement Information PLMS LMs Index  Total LMs during PLMS 0 0.0  LMs w/ Microarousals 0 0.0   LM LMs Index  w/ Microarousal 0 0.0  w/ Awakening 0 0.0  w/ Resp Event 1 0.2  Spontaneous 2 0.4  Total 3 0.6     Desaturation threshold setting: 3% Minimum desaturation setting: 10 seconds SaO2 nadir: 86% The longest event was a 24 sec central Apnea with a minimum SaO2 of 94%. The lowest SaO2 was 91% associated with a 13 sec central Apnea. EKG Rates EKG Avg Max Min  Awake 65 108 48  Asleep 58 84 46  EKG Events: N/A

## 2023-02-09 ENCOUNTER — Ambulatory Visit (INDEPENDENT_AMBULATORY_CARE_PROVIDER_SITE_OTHER): Payer: Self-pay | Admitting: Family Medicine

## 2023-02-09 ENCOUNTER — Encounter: Payer: Self-pay | Admitting: Family Medicine

## 2023-02-09 VITALS — BP 108/67 | HR 76 | Ht 66.0 in | Wt 134.0 lb

## 2023-02-09 DIAGNOSIS — J029 Acute pharyngitis, unspecified: Secondary | ICD-10-CM

## 2023-02-09 NOTE — Progress Notes (Signed)
   Acute Office Visit  Subjective:     Patient ID: [name], male    DOB: 04/22/1991, 32 y.o.   MRN: [id number]  Chief Complaint  Patient presents with   Sore Throat    Onset x 1 week    HPI   Patient states that for the past week he has had sore throat.  States he feels like he is "breathing in dust".  He has been taking Hall's lozenges and drinking throat soothing tea for relief with some effect.  States his symptoms feel worse at night or in the morning after he has been sleeping.  Has a history of acid reflux but treated this without medications by eating smaller portions and not eating so late at night.  Coughs but this is mostly in the morning.  Patient sleeps with his windows open.  Has had some mucus but has been clear.  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  Denies any fevers or chills.  Denies any itching eyes or ears, or difficulty hearing, or nasal congestion.  Patient occasionally vapes and smokes marijuana.  Has not since the symptoms began.  Has not been around any other people with similar symptoms.  Has no history of allergies.  Patient also complains of occasional globus sensation in the throat when he swallows but not more frequently than normal since developing a sore throat.  Only happens occasionally.  ROS      Objective:    BP 108/67   Pulse 76   Ht  (1.676 m)   Wt 134 lb (60.8 kg)   SpO2 100% Comment: on RA  BMI 21.63 kg/m  {Vitals History (Optional):23777}  Physical Exam General: Alert and oriented.  No acute distress HEENT: PERRLA midline uvula, no asymmetry.  No erythema.  No tonsillar hypertrophy.  No exudate.  Normal-appearing throat exam.  No results found for any visits on 02/09/23.      Assessment & Plan:   Problem List Items Addressed This Visit       Other   Sore throat - Primary    1 week of isolated sore throat with normal exam.  Has a history of GERD.  Description of  nighttime worsening most consistent with reflux disease.  Advised patient to try Pepcid in the evening daily for 7 days.  If this is not getting any relief, advised him to switch to a second-generation antihistamine over-the-counter for 1 week.  If neither of these improve his symptoms and he is still concerned we can  Send in an ENT referral.       No orders of the defined types were placed in this encounter.   Return if symptoms worsen or fail to improve.  Sandre Kitty, MD

## 2023-02-09 NOTE — Patient Instructions (Signed)
Take pepcid at dinnertime or later every day this week to see if this improves your symptoms.    If after 7 days it is not helping, switch to taking zyrtec or claritin every day.    If neither of these treatments improve your symptoms we can refer you to an ear nose and throat specialist.    Have a great day,   Dr. Constance Goltz

## 2023-02-09 NOTE — Assessment & Plan Note (Signed)
1 week of isolated sore throat with normal exam.  Has a history of GERD.  Description of nighttime worsening most consistent with reflux disease.  Advised patient to try Pepcid in the evening daily for 7 days.  If this is not getting any relief, advised him to switch to a second-generation antihistamine over-the-counter for 1 week.  If neither of these improve his symptoms and he is still concerned we can  Send in an ENT referral.

## 2023-08-30 ENCOUNTER — Ambulatory Visit (INDEPENDENT_AMBULATORY_CARE_PROVIDER_SITE_OTHER): Payer: Self-pay | Admitting: Family Medicine

## 2023-08-30 ENCOUNTER — Encounter: Payer: Self-pay | Admitting: Family Medicine

## 2023-08-30 VITALS — BP 113/70 | HR 82 | Ht 66.0 in | Wt 146.0 lb

## 2023-08-30 DIAGNOSIS — R220 Localized swelling, mass and lump, head: Secondary | ICD-10-CM | POA: Insufficient documentation

## 2023-08-30 DIAGNOSIS — M23303 Other meniscus derangements, unspecified medial meniscus, right knee: Secondary | ICD-10-CM | POA: Insufficient documentation

## 2023-08-30 NOTE — Patient Instructions (Addendum)
It was nice to see you today,  We addressed the following topics today: -For your knee injury, I would use Aleve twice a day as needed, do the exercises you have done in the past for meniscal injury, and avoid overworking or heavy lifting for least 2 weeks. - If your symptoms do not resolve in 6 weeks you can follow-up with [place] again - We can also reevaluate the bump on the roof of your mouth at that time as well. - Look into seeing if you qualify for Medicaid.  You can use the website or talk to somebody over the phone at the local Social services office.  Have a great day,  Frederic Jericho, MD

## 2023-08-30 NOTE — Progress Notes (Signed)
   Acute Office Visit  Subjective:     Patient ID: [name], male    DOB: December 10, 1990, 31 y.o.   MRN: [id number]  Chief Complaint  Patient presents with   Injury    HPI Patient is in today for right knee pain.  Patient previously had meniscal injury on the right knee in 2019 and had it repaired by Dr. Sherlean Foot.  Afterwards underwent rehab.  Has not had issues with it until last Friday.  Each incidence was triggered by the same activity.  He was sitting crosslegged and when he went to sit up he had severe pain upon standing.  He has not had any buckling of the knee but initially was not able to extend the knee fully that day.  Has been taking Aleve and elevating the knee.  Has been wearing a knee brace.  Currently does not have insurance.  Discussed conservative management options.  Patient lifts 50 pounds routinely as part of his job currently.  Does not need a work note at this time.  Patient complaining of a few weeks of noticing a hard bump on the roof of his mouth.  Initially noticed it after drinking a milkshake.  Never had issues this with this before.  Has never noticed a bump before.  ROS      Objective:    BP 113/70   Pulse 82   Ht 5\' 6"  (1.676 m)   Wt 146 lb (66.2 kg)   SpO2 99%   BMI 23.57 kg/m    Physical Exam General: Alert, oriented HEENT: Small hard, 1 mm,palpable nodule over the right side of the hard palate.  Not visible on exam.  Nontender.  No erythema. MSK: Tenderness ovation over the right medial tibial plateau.  No swelling noted.  Positive Thessaly test.  No tenderness elsewhere.  No results found for any visits on 08/30/23.      Assessment & Plan:   Derangement of medial meniscus of right knee Assessment & Plan: Recurrence of right knee pain caused by same movement that resulted in meniscal tear in 2019 (sitting crosslegged and rising from seated position).  At that time had meniscal repair.  Currently uninsured.  Recommended conservative  management with ice, Aleve, elevation, limiting overexertion/stress.  Encouraged patient to do rehab exercises which she had done previously after surgery.  Provided patient with information on rehab exercises. - Follow-up in 6 weeks - Recommend patient reach out to [facility] office to see if he qualifies for Medicaid based on income.   Palate mass Assessment & Plan: Small 1 mm or less hard nodule on the posterior right sided hard palate.  Not visible on exam but palpable.  Uncertain cause at this time.  Would favor mucocele.  Can reassess at next visit in 6 weeks.      Return in about 6 weeks (around 10/11/2023) for Knee injury.  Sandre Kitty, MD

## 2023-08-30 NOTE — Assessment & Plan Note (Signed)
Small 1 mm or less hard nodule on the posterior right sided hard palate.  Not visible on exam but palpable.  Uncertain cause at this time.  Would favor mucocele.  Can reassess at next visit in 6 weeks.

## 2023-08-30 NOTE — Assessment & Plan Note (Signed)
Recurrence of right knee pain caused by same movement that resulted in meniscal tear in 2019 (sitting crosslegged and rising from seated position).  At that time had meniscal repair.  Currently uninsured.  Recommended conservative management with ice, Aleve, elevation, limiting overexertion/stress.  Encouraged patient to do rehab exercises which she had done previously after surgery.  Provided patient with information on rehab exercises. - Follow-up in 6 weeks - Recommend patient reach out to [facility] office to see if he qualifies for Medicaid based on income.

## 2023-10-11 ENCOUNTER — Ambulatory Visit: Payer: Self-pay | Admitting: Family Medicine

## 2024-05-01 ENCOUNTER — Ambulatory Visit: Payer: Self-pay

## 2024-05-01 NOTE — Telephone Encounter (Signed)
 FYI Only or Action Required?: FYI only for provider.  Patient was last seen in primary care on 08/30/2023 by Chandra Toribio POUR, MD. Called Nurse Triage reporting Neck Pain. Symptoms began a week ago. Interventions attempted: OTC medications: Aleve/ Pain patches. Symptoms are: unchanged.  Triage Disposition: See PCP When Office is Open (Within 3 Days)  Patient/caregiver understands and will follow disposition?: Yes  **Patient will be seen in UC for immediate evaluation/treatment, and will follow up with PCP on 8/28, as there is no sooner avail. Appointments.                                Reason for Disposition  [1] MODERATE neck pain (e.g., interferes with normal activities) AND [2] present > 3 days  Answer Assessment - Initial Assessment Questions 1. ONSET: When did the pain begin?      X 1 week   2. LOCATION: Where does it hurt?      When he looks down, the top area hurts in the back of neck, top vertebrae area.    3. PATTERN Does the pain come and go, or has it been constant since it started?     Intermittent.     4. SEVERITY: How bad is the pain?  (Scale 1-10; or mild, moderate, severe)   - NO PAIN (0): no pain or only slight stiffness    - MILD (1-3): doesn't interfere with normal activities    - MODERATE (4-7): interferes with normal activities or awakens from sleep    - SEVERE (8-10):  excruciating pain, unable to do any normal activities        No pain, dull discomfort, now mild 4/10, at times 7/10.    5. RADIATION: Does the pain go anywhere else, shoot into your arms?     Radiates to center of the back    6. CORD SYMPTOMS: Any weakness or numbness of the arms or legs?      No   7. CAUSE: What do you think is causing the neck pain?     Possible scoliosis    8. NECK OVERUSE: Any recent activities that involved turning or twisting the neck?      Yes, moved a couch x 2 weeks ago  9. OTHER SYMPTOMS: Do you have any  other symptoms? (e.g., headache, fever, chest pain, difficulty breathing, neck swelling)      No    OTC; Aleve, pain patches  Protocols used: Neck Pain or Stiffness-A-AH

## 2024-06-22 ENCOUNTER — Encounter: Payer: Self-pay | Admitting: Family Medicine

## 2024-06-22 ENCOUNTER — Ambulatory Visit (INDEPENDENT_AMBULATORY_CARE_PROVIDER_SITE_OTHER): Payer: Self-pay | Admitting: Family Medicine

## 2024-06-22 VITALS — BP 108/65 | HR 59 | Ht 66.0 in | Wt 137.4 lb

## 2024-06-22 DIAGNOSIS — M542 Cervicalgia: Secondary | ICD-10-CM | POA: Insufficient documentation

## 2024-06-22 NOTE — Progress Notes (Signed)
   Acute Office Visit  Subjective:     Patient ID: Pancho Rushing IV, male    DOB: 1991-01-08, 33 y.o.   MRN: [id number]  Chief Complaint  Patient presents with   Back Pain    HPI Patient is in today for   Subjective - Neck pain for a couple of weeks. Reports prior episodes that resolved, but current episode is persistent. Pain started as a kink after trying a new pillow, then worsened after lifting a couch and dropping it, causing a downward force on the head. Pain radiates to shoulders and between the shoulder blades. Describes a sensation of pressure that needs to be cracked or released. Pain is present about 80% of the time, exacerbated by neck movement. When sitting still, it feels tight and uncomfortable rather than painful. Reports a crunching sensation with movement. - Intermittent auditory symptoms described as the head sounding like it is underwater and a high-pitched ringing sound. During these episodes, reports a brief sensation of hearing loss followed by ringing. No associated ear stuffiness. - Has tried stretching, hot showers, and alternating hot/cold packs with minimal relief. Side-to-side neck stretching exacerbates the pain. - Inquired about chiropractic manipulation for the neck pain and its safety, particularly with their history of scoliosis. - Inquired if scoliosis could be the cause of their lifelong migraines. Reports migraines have not worsened since the onset of neck pain.  Medications No current medications mentioned for this issue. Denied interest in oral medications.  PMH, PSH, FH, Social Hx PMHx: Migraines (lifelong), scoliosis. A cervical X-ray from 2008 was reviewed and did not show scoliosis in the neck.  ROS HEENT: Positive for intermittent tinnitus (high-pitched ringing) and a sensation of muffled hearing (underwater). Denies ear stuffiness. MSK: Positive for neck pain with radiation to shoulders and interscapular region. Reports neck  tightness and a crunchy sensation on movement. Neurology: Positive for history of migraines.   ROS      Objective:    BP 108/65   Pulse (!) 59   Ht 5' 6 (1.676 m)   Wt 137 lb 6.4 oz (62.3 kg)   SpO2 100%   BMI 22.18 kg/m    Physical Exam Gen: alert, oriented Pulm: no resp distress General: No acute distress. MSK: Palpation reveals tenderness at the cervicothoracic junction. Spine appears straight on visual inspection when standing. Full flexion at the waist demonstrates tenderness over the upper thoracic spine.  No results found for any visits on 06/22/24.      Assessment & Plan:   Neck pain Assessment & Plan: Cervical/Thoracic Strain vs. Bulging Disc: The patient presents with a two-week history of neck and upper back pain that started after using a new pillow and was exacerbated by a specific injury while lifting a couch. The pain radiates to the shoulders and interscapular area. Examination reveals tenderness at the cervicothoracic junction.  - Patient declined oral medications and requested information on non-pharmacologic treatment options. - Discussed various treatment options: stretching, PT, heat, topical pain relievers, cervical traction device, dry needling, shockwave therapy. -discussed risks/benefits of chiropractic manipulation.   - If symptoms do not improve after trying conservative measures, a referral can be placed for sports medicine, physical therapy, or orthopedics. The patient was advised to send a message to request a referral if needed, without requiring another appointment.        Return if symptoms worsen or fail to improve.  Toribio MARLA Slain, MD

## 2024-06-22 NOTE — Patient Instructions (Signed)
 It was nice to see you today,  We addressed the following topics today: -Some options you can try for your neck pain that are not oral medications include the following: - Dry needling - Shockwave therapy - Cervical traction devices - chirp wheel - Chiropractor  - If you try some of the above like cervical traction devices or a chiropractor and you would like me to send in a referral to sports medicine, physical therapy, or orthopedics to send us  a message and I will put the order in.   Have a great day,  Rolan Slain, MD

## 2024-06-22 NOTE — Assessment & Plan Note (Addendum)
 Cervical/Thoracic Strain vs. Bulging Disc: The patient presents with a two-week history of neck and upper back pain that started after using a new pillow and was exacerbated by a specific injury while lifting a couch. The pain radiates to the shoulders and interscapular area. Examination reveals tenderness at the cervicothoracic junction.  - Patient declined oral medications and requested information on non-pharmacologic treatment options. - Discussed various treatment options: stretching, PT, heat, topical pain relievers, cervical traction device, dry needling, shockwave therapy. -discussed risks/benefits of chiropractic manipulation.   - If symptoms do not improve after trying conservative measures, a referral can be placed for sports medicine, physical therapy, or orthopedics. The patient was advised to send a message to request a referral if needed, without requiring another appointment.
